# Patient Record
Sex: Male | Born: 1985 | Hispanic: Yes | Smoking: Never smoker
Health system: Southern US, Community
[De-identification: ages and names within clinical notes are randomized; demographics above are authoritative.]

## PROBLEM LIST (undated history)

## (undated) ENCOUNTER — Emergency Department: Admission: EM | Payer: Self-pay | Source: Home / Self Care

## (undated) DIAGNOSIS — E119 Type 2 diabetes mellitus without complications: Secondary | ICD-10-CM

---

## 2014-12-14 ENCOUNTER — Encounter: Payer: Self-pay | Admitting: *Deleted

## 2014-12-14 ENCOUNTER — Emergency Department
Admission: EM | Admit: 2014-12-14 | Discharge: 2014-12-14 | Disposition: A | Discharge status: Home / Self Care | Payer: Self-pay | Attending: Emergency Medicine | Admitting: Emergency Medicine

## 2014-12-14 DIAGNOSIS — Y9389 Activity, other specified: Secondary | ICD-10-CM | POA: Insufficient documentation

## 2014-12-14 DIAGNOSIS — Z23 Encounter for immunization: Secondary | ICD-10-CM | POA: Insufficient documentation

## 2014-12-14 DIAGNOSIS — Y9289 Other specified places as the place of occurrence of the external cause: Secondary | ICD-10-CM | POA: Insufficient documentation

## 2014-12-14 DIAGNOSIS — Y990 Civilian activity done for income or pay: Secondary | ICD-10-CM | POA: Insufficient documentation

## 2014-12-14 DIAGNOSIS — S81812A Laceration without foreign body, left lower leg, initial encounter: Secondary | ICD-10-CM | POA: Insufficient documentation

## 2014-12-14 DIAGNOSIS — W228XXA Striking against or struck by other objects, initial encounter: Secondary | ICD-10-CM | POA: Insufficient documentation

## 2014-12-14 DIAGNOSIS — IMO0002 Reserved for concepts with insufficient information to code with codable children: Secondary | ICD-10-CM

## 2014-12-14 MED ORDER — LIDOCAINE-EPINEPHRINE (PF) 1 %-1:200000 IJ SOLN
INTRAMUSCULAR | Status: AC
  Administered 2014-12-14: 30 mL
  Filled 2014-12-14: qty 30

## 2014-12-14 MED ORDER — TETANUS-DIPHTHERIA TOXOIDS TD 5-2 LFU IM INJ
0.5000 mL | INJECTION | Freq: Once | INTRAMUSCULAR | Status: AC
  Administered 2014-12-14: 0.5 mL via INTRAMUSCULAR
  Filled 2014-12-14: qty 0.5

## 2014-12-14 MED ORDER — LIDOCAINE-EPINEPHRINE (PF) 1 %-1:200000 IJ SOLN
30.0000 mL | Freq: Once | INTRAMUSCULAR | Status: AC
  Administered 2014-12-14: 30 mL

## 2014-12-14 NOTE — ED Notes (Signed)
Pt reports falling off ladder while at working and hitting his lower left leg on one of the steps, small wound noted to left lower leg in triage.

## 2014-12-14 NOTE — Discharge Instructions (Signed)
Cuidados de una laceración - Adultos  °(Laceration Care, Adult) ° Una herida cortante es un corte o lesión que atraviesa todas las capas de la piel y el tejido que se encuentra debajo de la piel.  °TRATAMIENTO  °Algunas laceraciones no requieren sutura. Algunas no deben cerrarse debido a que puede aumentar el riesgo de infección. Es importante que consulte al médico lo antes posible después de recibir una lesión para minimizar el riesgo de infección y aumentar la posibilidad de que se cierre con éxito.  °Cuando se cierra adecuadamente, podrán indicarle analgésicos, si los necesita. La herida debe limpiarse para combatir la infección. El médico usará puntos (suturas), grapas,adhesivo, o tiras adhesivas para reparar la laceración. Estos elementos mantendrán unidos los bordes de la piel para que se cure más rápidamente y para un mejor resultado cosmético. Sin embargo, todas las heridas se curarán con una cicatriz. Una vez que la herida se haya curado, las cicatrices pueden minimizarse cubriendo la herida con pantalla solar durante el día por un lapso se 1 año.  °INSTRUCCIONES PARA EL CUIDADO EN EL HOGAR  °Si tiene puntos o grapas:  °· Mantenga la herida limpia y seca. °· Si tiene un (vendaje) cámbielo al menos una vez al día. Cámbielo si se moja o se ensucia, o según las indicaciones del médico. °· Lave el corte dos veces por día con agua y jabón. Enjuáguelo con agua para quitar todo el jabón. Seque dando palmaditas con una toalla limpia y seca. °· Después de limpiar, aplique una delgada capa de una crema con antibiótico según las indicaciones del médico. Esto le ayudará a prevenir las infecciones y a evitar que el vendaje se adhiera. °· Puede ducharse después de las primeras 24 horas. No remoje la herida en agua hasta que le hayan quitado los puntos. °· Solo tome medicamentos que se pueden comprar sin receta o recetados para el dolor, malestar o fiebre, como le indica el médico. °· Concurra para que le retiren los  puntos o las grapas cuando el médico le indique. °En caso que tenga tiras adhesivas:  °· Mantenga la herida limpia y seca. °· No deje que las tiras se mojen. Puede darse un baño cuidando de mantener la herida seca. °· Si se moja, séquela dando palmaditas con una toalla limpia. °· Las tiras caerán por sí mismas. Puede recortar las tiras a medida que la herida se cura. No quite las tiras que están pegadas a la herida. Ellas se caerán cuando sea el momento. °En caso que le hayan aplicado adhesivo.  °· Podrá mojara momentáneamente la herida en la ducha o el baño. No frote ni sumerja la herida. No practique natación. Evite transpirar con abundancia hasta que el adhesivo se haya caído. Después de ducharse o darse un baño, seque el corte dando palmaditas con una toalla limpia. °· No aplique medicamentos líquidos, en crema o ungüentos mientras el adhesivo esté en su lugar. Podrá aflojarlo antes de que la herida se cure. °· Si tiene un vendaje, tenga cuidado de no aplicar cinta adhesiva directamente sobre el adhesivo. Esto puede hacer que el adhesivo se caiga antes de que la herida se haya curado. °· Evite la exposición prolongada a la luz del sol o a la lámpara solar mientras en adhesivo se encuentre en el lugar. La exposición a los rayos ultravioletas durante el primer año oscurecerá la cicatriz. °· El adhesivo permanecerá sobre la piel durante 5 a 10 días y luego caerá naturalmente. No quite la película de adhesivo. °Deberá aplicarse   la vacuna contra el tétanos si: °· No recuerda cuándo se colocó la vacuna la última vez. °· Nunca recibió esta vacuna. °Si le han aplicado la vacuna contra el tétanos, el brazo podrá hincharse, enrojecer y sentirse caliente al tacto. Esto es frecuente y no es un problema. Si usted necesita aplicarse la vacuna y se niega a recibirla, corre riesgo de contraer tétanos. Ésta es una enfermedad grave.  °SOLICITE ATENCIÓN MÉDICA SI:  °· Presenta enrojecimiento, hinchazón o aumento del dolor en la  herida. °· Hay rayas rojas que salen de la herida. °· Observa un líquido blanco amarillento (pus) en la herida. °· Tiene fiebre. °· Advierte un olor fétido que proviene de la herida o del vendaje. °· La herida se abre luego de que le han extraído las suturas. °· Nota que en la herida hay algún cuerpo extraño como un trozo de madera o vidrio. °· La herida está en su mano o pie y observa que no puede mover correctamente los dedos. °SOLICITE ATENCIÓN MÉDICA DE INMEDIATO SI:  °· El dolor no se alivia con los medicamentos. °· Hay una zona muy hinchada alrededor de la herida que le causa dolor y adormecimiento, o advierte un cambio en el color en el brazo, la mano, la pierna o el pie. °· La herida se abre y sangra nuevamente. °· Siente que el adormecimiento, la debilidad o la pérdida de la función de la articulación que rodea la herida empeoran. °· Palpa nódulos dolorosos cerca de la herida o bajo la piel en cualquier zona del cuerpo. °ASEGÚRESE DE QUE:  °· Comprende estas instrucciones. °· Controlará su enfermedad. °· Solicitará ayuda de inmediato si no mejora o si empeora. °Document Released: 05/18/2005 Document Revised: 08/10/2011 °ExitCare® Patient Information ©2015 [employer]. This information is not intended to replace advice given to you by your health care provider. Make sure you discuss any questions you have with your health care provider. ° °

## 2014-12-14 NOTE — ED Provider Notes (Signed)
[facility] Emergency Department Provider Note  ____________________________________________  Time seen: On arrival  I have reviewed the triage vital signs and the nursing notes.   HISTORY  Chief Complaint Leg Pain    HPI Dale Schwartz is a 29 y.o. male who presents with a laceration to his left lower leg which was caused after bumping it against a ladder while at work. He is not sure when his last tetanus shot was. No other injuries reported.    History reviewed. No pertinent past medical history.  There are no active problems to display for this patient.   History reviewed. No pertinent past surgical history.  No current outpatient prescriptions on file.  Allergies Review of patient's allergies indicates no known allergies.  No family history on file.  Social History History  Substance Use Topics  . Smoking status: Never Smoker   . Smokeless tobacco: Not on file  . Alcohol Use: No    Review of Systems  Constitutional: Negative for fever. Eyes: Negative for visual changes. ENT: No injury Musculoskeletal: Negative for back pain. Skin: Negative for rash. Positive for laceration Neurological: Negative for headaches or focal weakness   ____________________________________________   PHYSICAL EXAM:  VITAL SIGNS: ED Triage Vitals  Enc Vitals Group     BP 12/14/14 1516 119/76 mmHg     Pulse Rate 12/14/14 1516 64     Resp 12/14/14 1516 16     Temp 12/14/14 1516 98.3 F (36.8 C)     Temp Source 12/14/14 1516 Oral     SpO2 12/14/14 1516 99 %     Weight --      Height --      Head Cir --      Peak Flow --      Pain Score 12/14/14 1517 5     Pain Loc --      Pain Edu? --      Excl. in GC? --      Constitutional: Alert and oriented. Well appearing and in no distress. Eyes: Conjunctivae are normal.  ENT   Head: Normocephalic and atraumatic.   Mouth/Throat: Mucous membranes are moist. Cardiovascular: Normal  rate, regular rhythm.  Respiratory: Normal respiratory effort without tachypnea nor retractions.  Gastrointestinal: Soft and non-tender in all quadrants. No distention. There is no CVA tenderness. Musculoskeletal: Nontender with normal range of motion in all extremities. Neurologic:  Normal speech and language. No gross focal neurologic deficits are appreciated. Skin:  Skin is warm, dry. 4 cm laceration to the left anterior tibia, irregular, grossly linear. Psychiatric: Mood and affect are normal. Patient exhibits appropriate insight and judgment.  ____________________________________________    LABS (pertinent positives/negatives)  Labs Reviewed - No data to display  ____________________________________________     ____________________________________________    RADIOLOGY I have personally reviewed any xrays that were ordered on this patient: None  ____________________________________________   PROCEDURES  Procedure(s) performed: yes  LACERATION REPAIR Performed by: Jene EveryKINNER, Shrey Boike Authorized by: Jene EveryKINNER, Leidy Massar Consent: Verbal consent obtained. Consent given by: patient Prepped and Draped in normal sterile fashion Wound explored  Laceration Location: Left anterior tibia  Laceration Length: 4 cm  No Foreign Bodies seen or palpated  Anesthesia: local infiltration  Local anesthetic: lidocaine 1%  with epinephrine  Anesthetic total: Four ml  Irrigation method: syringe Amount of cleaning: standard  Skin closure: Sutures   Number of sutures: 2   Technique: Horizontal mattress   Patient tolerance: Patient tolerated the procedure well with no immediate complications.  ____________________________________________   INITIAL IMPRESSION / ASSESSMENT AND PLAN / ED COURSE  Pertinent labs & imaging results that were available during my care of the patient were reviewed by me and considered in my medical decision making (see chart for details).  Laceration  repaired, tetanus given. Sutures need to be removed in 10 days  ____________________________________________   FINAL CLINICAL IMPRESSION(S) / ED DIAGNOSES  Final diagnoses:  Laceration     Jene Every, MD 12/14/14 [phone]

## 2019-02-12 ENCOUNTER — Emergency Department: Payer: Self-pay

## 2019-02-12 ENCOUNTER — Other Ambulatory Visit: Payer: Self-pay

## 2019-02-12 ENCOUNTER — Emergency Department
Admission: EM | Admit: 2019-02-12 | Discharge: 2019-02-12 | Disposition: A | Discharge status: Home / Self Care | Payer: Self-pay | Attending: Emergency Medicine | Admitting: Emergency Medicine

## 2019-02-12 DIAGNOSIS — X58XXXD Exposure to other specified factors, subsequent encounter: Secondary | ICD-10-CM | POA: Insufficient documentation

## 2019-02-12 DIAGNOSIS — Y939 Activity, unspecified: Secondary | ICD-10-CM | POA: Insufficient documentation

## 2019-02-12 DIAGNOSIS — Y990 Civilian activity done for income or pay: Secondary | ICD-10-CM | POA: Insufficient documentation

## 2019-02-12 DIAGNOSIS — M25561 Pain in right knee: Secondary | ICD-10-CM | POA: Insufficient documentation

## 2019-02-12 DIAGNOSIS — Y9289 Other specified places as the place of occurrence of the external cause: Secondary | ICD-10-CM | POA: Insufficient documentation

## 2019-02-12 MED ORDER — MELOXICAM 15 MG PO TABS: 15.0000 mg | ORAL_TABLET | Freq: Every day | ORAL | 1 refills | Status: AC

## 2019-02-12 NOTE — ED Notes (Signed)
Pt with c/o right knee pain and states it comes out of joint frequently if he bends it too far.  Injury from several months ago. Pt states he is getting PT in [place] but they recommended he get images of his knee due to frequent dislocation.

## 2019-02-12 NOTE — ED Provider Notes (Signed)
[facility] Emergency Department Provider Note  ____________________________________________  Time seen: Approximately 9:35 PM  I have reviewed the triage vital signs and the nursing notes.   HISTORY  Chief Complaint Knee Pain    HPI Dale Schwartz is a 33 y.o. male presents to the emergency department with chronic right knee pain.  Patient states that he was injured at work several months ago.  Patient states that he has a locking and catching sensation of the right knee and patient states that his right knee bothers him when he stands for prolonged amount of time.  No numbness or tingling in the bilateral lower extremities.  No weakness.  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  No other alleviating measures have been attempted.        No past medical history on file.  There are no active problems to display for this patient.   No past surgical history on file.  Prior to Admission medications   Medication Sig Start Date End Date Taking? Authorizing Provider  meloxicam (MOBIC) 15 MG tablet Take 1 tablet (15 mg total) by mouth daily for 7 days. 02/12/19 02/19/19  Orvil FeilWoods, Deena Shaub M, PA-C    Allergies Patient has no known allergies.  No family history on file.  Social History Social History   Tobacco Use  . Smoking status: Never Smoker  Substance Use Topics  . Alcohol use: No  . Drug use: No     Review of Systems  Constitutional: No fever/chills Eyes: No visual changes. No discharge ENT: No upper respiratory complaints. Cardiovascular: no chest pain. Respiratory: no cough. No SOB. Gastrointestinal: No abdominal pain.  No nausea, no vomiting.  No diarrhea.  No constipation. Musculoskeletal: Patient has right knee pain. Skin: Negative for rash, abrasions, lacerations, ecchymosis. Neurological: Negative for headaches, focal weakness or  numbness.   ____________________________________________   PHYSICAL EXAM:  VITAL SIGNS: ED Triage Vitals  Enc Vitals Group     BP 02/12/19 1937 (!) 143/88     Pulse Rate 02/12/19 1937 100     Resp 02/12/19 1937 16     Temp 02/12/19 1937 98.6 F (37 C)     Temp Source 02/12/19 1937 Oral     SpO2 02/12/19 1937 97 %     Weight 02/12/19 1941 225 lb (102.1 kg)     Height 02/12/19 1941 5\' 9"  (1.753 m)     Head Circumference --      Peak Flow --      Pain Score 02/12/19 1940 6     Pain Loc --      Pain Edu? --      Excl. in GC? --      Constitutional: Alert and oriented. Well appearing and in no acute distress. Eyes: Conjunctivae are normal. PERRL. EOMI. Head: Atraumatic. Cardiovascular: Normal rate, regular rhythm. Normal S1 and S2.  Good peripheral circulation. Respiratory: Normal respiratory effort without tachypnea or retractions. Lungs CTAB. Good air entry to the bases with no decreased or absent breath sounds. Musculoskeletal: Full range of motion to all extremities. No gross deformities appreciated. Neurologic:  Normal speech and language. No gross focal neurologic deficits are appreciated.  Skin:  Skin is warm, dry and intact. No rash noted. Psychiatric: Mood and affect are normal. Speech and behavior are normal. Patient exhibits appropriate insight and judgement.   ____________________________________________   LABS (all labs ordered are listed, but only abnormal results are displayed)  Labs  Reviewed - No data to display ____________________________________________  EKG   ____________________________________________  RADIOLOGY I personally viewed and evaluated these images as part of my medical decision making, as well as reviewing the written report by the radiologist.  Dg Knee Complete 4 Views Right  Result Date: 02/12/2019 CLINICAL DATA:  5 month history of knee pain after work related injury. EXAM: RIGHT KNEE - COMPLETE 4+ VIEW COMPARISON:  No  comparison studies available. FINDINGS: No fracture. No subluxation or dislocation. Hypertrophic spurring is visible in all 3 compartments. No joint effusion. No suspicious lytic or sclerotic osseous abnormality. IMPRESSION: Tricompartmental degenerative changes without acute bony abnormality. Electronically Signed   By: Misty Stanley M.D.   On: 02/12/2019 21:06    ____________________________________________    PROCEDURES  Procedure(s) performed:    Procedures    Medications - No data to display   ____________________________________________   INITIAL IMPRESSION / ASSESSMENT AND PLAN / ED COURSE  Pertinent labs & imaging results that were available during my care of the patient were reviewed by me and considered in my medical decision making (see chart for details).  Review of the [place] CSRS was performed in accordance of the [place] prior to dispensing any controlled drugs.           Assessment and plan Right knee pain 33 year old male presents to the emergency department with chronic right knee pain.  X-ray examination revealed tricompartmental degenerative changes.  I recommended daily meloxicam and follow-up with orthopedics.  Patient was given a work note for the next 2 days.  All patient questions were answered.    ____________________________________________  FINAL CLINICAL IMPRESSION(S) / ED DIAGNOSES  Final diagnoses:  Acute pain of right knee      NEW MEDICATIONS STARTED DURING THIS VISIT:  ED Discharge Orders         Ordered    meloxicam (MOBIC) 15 MG tablet  Daily     02/12/19 2133              This chart was dictated using voice recognition software/Dragon. Despite best efforts to proofread, errors can occur which can change the meaning. Any change was purely unintentional.    Karren Cobble 02/12/19 2137    Duffy Bruce, MD 02/14/19 [phone]

## 2019-02-12 NOTE — ED Triage Notes (Signed)
Info obtained via [facility] interpreter [place].  Reports right knee pain for approximately 5 months after a work related injury. Seen at a clinic in [place] and they recommended  a scan of the knee but did not follow up.  Reports occasional "hits" knee at work and it hurts.

## 2019-05-18 ENCOUNTER — Emergency Department: Payer: Self-pay

## 2019-05-18 ENCOUNTER — Inpatient Hospital Stay
Admission: EM | Admit: 2019-05-18 | Discharge: 2019-05-29 | DRG: 392 | Disposition: A | Discharge status: Home / Self Care | Payer: Self-pay | Attending: Surgery | Admitting: Surgery

## 2019-05-18 ENCOUNTER — Encounter: Payer: Self-pay | Admitting: Emergency Medicine

## 2019-05-18 ENCOUNTER — Other Ambulatory Visit: Payer: Self-pay

## 2019-05-18 DIAGNOSIS — Z23 Encounter for immunization: Secondary | ICD-10-CM

## 2019-05-18 DIAGNOSIS — Z20828 Contact with and (suspected) exposure to other viral communicable diseases: Secondary | ICD-10-CM | POA: Diagnosis present

## 2019-05-18 DIAGNOSIS — K5720 Diverticulitis of large intestine with perforation and abscess without bleeding: Principal | ICD-10-CM

## 2019-05-18 DIAGNOSIS — K5780 Diverticulitis of intestine, part unspecified, with perforation and abscess without bleeding: Secondary | ICD-10-CM

## 2019-05-18 DIAGNOSIS — E669 Obesity, unspecified: Secondary | ICD-10-CM | POA: Diagnosis present

## 2019-05-18 DIAGNOSIS — K760 Fatty (change of) liver, not elsewhere classified: Secondary | ICD-10-CM | POA: Diagnosis present

## 2019-05-18 DIAGNOSIS — K567 Ileus, unspecified: Secondary | ICD-10-CM | POA: Diagnosis not present

## 2019-05-18 DIAGNOSIS — K3533 Acute appendicitis with perforation and localized peritonitis, with abscess: Secondary | ICD-10-CM

## 2019-05-18 DIAGNOSIS — J9811 Atelectasis: Secondary | ICD-10-CM | POA: Diagnosis present

## 2019-05-18 DIAGNOSIS — Z6837 Body mass index (BMI) 37.0-37.9, adult: Secondary | ICD-10-CM

## 2019-05-18 DIAGNOSIS — B954 Other streptococcus as the cause of diseases classified elsewhere: Secondary | ICD-10-CM | POA: Diagnosis present

## 2019-05-18 DIAGNOSIS — B9620 Unspecified Escherichia coli [E. coli] as the cause of diseases classified elsewhere: Secondary | ICD-10-CM | POA: Diagnosis present

## 2019-05-18 HISTORY — DX: Type 2 diabetes mellitus without complications: E11.9

## 2019-05-18 LAB — URINALYSIS, COMPLETE (UACMP) WITH MICROSCOPIC
Bacteria, UA: NONE SEEN
Bilirubin Urine: NEGATIVE
Glucose, UA: NEGATIVE mg/dL
Hgb urine dipstick: NEGATIVE
Ketones, ur: NEGATIVE mg/dL
Leukocytes,Ua: NEGATIVE
Nitrite: NEGATIVE
Protein, ur: 30 mg/dL — AB
Specific Gravity, Urine: 1.023 (ref 1.005–1.030)
pH: 7 (ref 5.0–8.0)

## 2019-05-18 LAB — CBC
HCT: 40.8 % (ref 39.0–52.0)
Hemoglobin: 14 g/dL (ref 13.0–17.0)
MCH: 29.7 pg (ref 26.0–34.0)
MCHC: 34.3 g/dL (ref 30.0–36.0)
MCV: 86.4 fL (ref 80.0–100.0)
Platelets: 290 10*3/uL (ref 150–400)
RBC: 4.72 MIL/uL (ref 4.22–5.81)
RDW: 12.3 % (ref 11.5–15.5)
WBC: 15.7 10*3/uL — ABNORMAL HIGH (ref 4.0–10.5)
nRBC: 0 % (ref 0.0–0.2)

## 2019-05-18 LAB — COMPREHENSIVE METABOLIC PANEL
ALT: 55 U/L — ABNORMAL HIGH (ref 0–44)
AST: 26 U/L (ref 15–41)
Albumin: 4.3 g/dL (ref 3.5–5.0)
Alkaline Phosphatase: 44 U/L (ref 38–126)
Anion gap: 11 (ref 5–15)
BUN: 13 mg/dL (ref 6–20)
CO2: 23 mmol/L (ref 22–32)
Calcium: 9.3 mg/dL (ref 8.9–10.3)
Chloride: 102 mmol/L (ref 98–111)
Creatinine, Ser: 0.95 mg/dL (ref 0.61–1.24)
GFR calc Af Amer: >60 mL/min (ref >60)
GFR calc non Af Amer: >60 mL/min (ref >60)
Glucose, Bld: 121 mg/dL — ABNORMAL HIGH (ref 70–99)
Potassium: 4.1 mmol/L (ref 3.5–5.1)
Sodium: 136 mmol/L (ref 135–145)
Total Bilirubin: 1.7 mg/dL — ABNORMAL HIGH (ref 0.3–1.2)
Total Protein: 7.9 g/dL (ref 6.5–8.1)

## 2019-05-18 LAB — RESPIRATORY PANEL BY RT PCR (FLU A&B, COVID)
Influenza A by PCR: NEGATIVE
Influenza B by PCR: NEGATIVE
SARS Coronavirus 2 by RT PCR: NEGATIVE

## 2019-05-18 LAB — LIPASE, BLOOD: Lipase: 21 U/L (ref 11–51)

## 2019-05-18 LAB — HIV ANTIBODY (ROUTINE TESTING W REFLEX): HIV Screen 4th Generation wRfx: NONREACTIVE

## 2019-05-18 MED ORDER — KETOROLAC TROMETHAMINE 30 MG/ML IJ SOLN
30.0000 mg | Freq: Four times a day (QID) | INTRAMUSCULAR | Status: AC | PRN
  Administered 2019-05-23 – 2019-05-24 (×2): 30 mg via INTRAVENOUS
  Filled 2019-05-18 (×2): qty 1

## 2019-05-18 MED ORDER — MORPHINE SULFATE (PF) 2 MG/ML IV SOLN
2.0000 mg | INTRAVENOUS | Status: DC | PRN
  Administered 2019-05-18 – 2019-05-19 (×2): 2 mg via INTRAVENOUS
  Administered 2019-05-20: 4 mg via INTRAVENOUS
  Administered 2019-05-24: 2 mg via INTRAVENOUS
  Administered 2019-05-24 – 2019-05-26 (×5): 4 mg via INTRAVENOUS
  Administered 2019-05-26: 2 mg via INTRAVENOUS
  Filled 2019-05-18 (×2): qty 2
  Filled 2019-05-18: qty 1
  Filled 2019-05-18: qty 2
  Filled 2019-05-18: qty 1
  Filled 2019-05-18: qty 2
  Filled 2019-05-18: qty 1
  Filled 2019-05-18 (×3): qty 2
  Filled 2019-05-18: qty 1

## 2019-05-18 MED ORDER — PIPERACILLIN-TAZOBACTAM 3.375 G IVPB
3.3750 g | Freq: Three times a day (TID) | INTRAVENOUS | Status: DC
  Filled 2019-05-18: qty 50

## 2019-05-18 MED ORDER — ENOXAPARIN SODIUM 40 MG/0.4ML ~~LOC~~ SOLN
40.0000 mg | SUBCUTANEOUS | Status: DC
  Administered 2019-05-18 – 2019-05-22 (×5): 40 mg via SUBCUTANEOUS
  Filled 2019-05-18 (×5): qty 0.4

## 2019-05-18 MED ORDER — METOPROLOL TARTRATE 5 MG/5ML IV SOLN
5.0000 mg | Freq: Four times a day (QID) | INTRAVENOUS | Status: DC | PRN
  Administered 2019-05-21: 5 mg via INTRAVENOUS
  Filled 2019-05-18 (×2): qty 5

## 2019-05-18 MED ORDER — INFLUENZA VAC SPLIT QUAD 0.5 ML IM SUSY
0.5000 mL | PREFILLED_SYRINGE | INTRAMUSCULAR | Status: AC
  Administered 2019-05-29: 08:00:00 0.5 mL via INTRAMUSCULAR
  Filled 2019-05-18: qty 0.5

## 2019-05-18 MED ORDER — SODIUM CHLORIDE 0.9 % IV BOLUS
1000.0000 mL | Freq: Once | INTRAVENOUS | Status: AC
  Administered 2019-05-18: 10:00:00 1000 mL via INTRAVENOUS

## 2019-05-18 MED ORDER — OXYCODONE HCL 5 MG PO TABS
5.0000 mg | ORAL_TABLET | ORAL | Status: DC | PRN
  Administered 2019-05-18: 22:00:00 5 mg via ORAL
  Administered 2019-05-19 – 2019-05-25 (×6): 10 mg via ORAL
  Filled 2019-05-18: qty 1
  Filled 2019-05-18 (×6): qty 2

## 2019-05-18 MED ORDER — ONDANSETRON 4 MG PO TBDP: 4.0000 mg | ORAL_TABLET | Freq: Four times a day (QID) | ORAL | Status: DC | PRN

## 2019-05-18 MED ORDER — ACETAMINOPHEN 500 MG PO TABS
1000.0000 mg | ORAL_TABLET | Freq: Four times a day (QID) | ORAL | Status: DC
  Administered 2019-05-18 – 2019-05-29 (×34): 1000 mg via ORAL
  Filled 2019-05-18 (×37): qty 2

## 2019-05-18 MED ORDER — SODIUM CHLORIDE 0.9 % IV SOLN: INTRAVENOUS | Status: DC

## 2019-05-18 MED ORDER — ONDANSETRON HCL 4 MG/2ML IJ SOLN
4.0000 mg | Freq: Four times a day (QID) | INTRAMUSCULAR | Status: DC | PRN
  Administered 2019-05-20 – 2019-05-25 (×6): 4 mg via INTRAVENOUS
  Filled 2019-05-18 (×6): qty 2

## 2019-05-18 MED ORDER — KETOROLAC TROMETHAMINE 30 MG/ML IJ SOLN
30.0000 mg | Freq: Four times a day (QID) | INTRAMUSCULAR | Status: AC
  Administered 2019-05-18 – 2019-05-23 (×20): 30 mg via INTRAVENOUS
  Filled 2019-05-18 (×20): qty 1

## 2019-05-18 MED ORDER — PIPERACILLIN-TAZOBACTAM 3.375 G IVPB 30 MIN
3.3750 g | Freq: Once | INTRAVENOUS | Status: AC
  Administered 2019-05-18: 10:00:00 3.375 g via INTRAVENOUS
  Filled 2019-05-18: qty 50

## 2019-05-18 MED ORDER — PIPERACILLIN-TAZOBACTAM 3.375 G IVPB
3.3750 g | Freq: Three times a day (TID) | INTRAVENOUS | Status: DC
  Administered 2019-05-18 – 2019-05-29 (×33): 3.375 g via INTRAVENOUS
  Filled 2019-05-18 (×36): qty 50

## 2019-05-18 MED ORDER — SODIUM CHLORIDE 0.9 % IV SOLN
INTRAVENOUS | Status: DC | PRN
  Administered 2019-05-18 – 2019-05-25 (×3): 250 mL via INTRAVENOUS
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
## 2019-05-18 NOTE — ED Notes (Signed)
Report given to [facility] RN on [place], pt will be transported to room after 1930

## 2019-05-19 ENCOUNTER — Inpatient Hospital Stay: Payer: Self-pay

## 2019-05-19 LAB — BASIC METABOLIC PANEL
Anion gap: 10 (ref 5–15)
BUN: 17 mg/dL (ref 6–20)
CO2: 24 mmol/L (ref 22–32)
Calcium: 8.6 mg/dL — ABNORMAL LOW (ref 8.9–10.3)
Chloride: 104 mmol/L (ref 98–111)
Creatinine, Ser: 1.15 mg/dL (ref 0.61–1.24)
GFR calc Af Amer: >60 mL/min (ref >60)
GFR calc non Af Amer: >60 mL/min (ref >60)
Glucose, Bld: 130 mg/dL — ABNORMAL HIGH (ref 70–99)
Potassium: 3.9 mmol/L (ref 3.5–5.1)
Sodium: 138 mmol/L (ref 135–145)

## 2019-05-19 LAB — CBC
HCT: 37.2 % — ABNORMAL LOW (ref 39.0–52.0)
Hemoglobin: 12.6 g/dL — ABNORMAL LOW (ref 13.0–17.0)
MCH: 29.4 pg (ref 26.0–34.0)
MCHC: 33.9 g/dL (ref 30.0–36.0)
MCV: 86.9 fL (ref 80.0–100.0)
Platelets: 242 10*3/uL (ref 150–400)
RBC: 4.28 MIL/uL (ref 4.22–5.81)
RDW: 12.5 % (ref 11.5–15.5)
WBC: 20.1 10*3/uL — ABNORMAL HIGH (ref 4.0–10.5)
nRBC: 0 % (ref 0.0–0.2)

## 2019-05-19 LAB — PHOSPHORUS: Phosphorus: 3 mg/dL (ref 2.5–4.6)

## 2019-05-19 LAB — MAGNESIUM: Magnesium: 1.7 mg/dL (ref 1.7–2.4)

## 2019-05-19 MED ORDER — SODIUM CHLORIDE 0.9 % IV BOLUS
1000.0000 mL | Freq: Once | INTRAVENOUS | Status: AC
  Administered 2019-05-19: 08:00:00 1000 mL via INTRAVENOUS

## 2019-05-19 NOTE — Progress Notes (Signed)
[facility] SURGICAL PROGRESS NOTE (cpt [phone])  Hospital Day(s): 1.   Interval History: Patient seen and examined, no acute events or new complaints overnight. Patient reports his abdominal pain has improved compared to yesterday, still in the lower abdomen, less severe. No nausea or emesis. Fever to 101.4 this morning at 0530, tachycardic 116, leukocytosis worsening to 20K. No other new issues or complaints.   Review of Systems:  Constitutional: + fever HEENT: denies cough or congestion  Respiratory: denies any shortness of breath  Cardiovascular: denies chest pain or palpitations  Gastrointestinal: + abdominal pain (improved), denied N/V, or diarrhea/and bowel function as per interval history Genitourinary: denies burning with urination or urinary frequency   Vital signs in last 24 hours: [min-max] current  Temp:  [99.1 F (37.3 C)-101.4 F (38.6 C)] 99.1 F (37.3 C) (12/18 0647) Pulse Rate:  [99-116] 116 (12/18 0526) Resp:  [18] 18 (12/18 0526) BP: (113-138)/(72-89) 131/87 (12/18 0526) SpO2:  [95 %-97 %] 95 % (12/18 0526)     Height: 5\' 8"  (172.7 cm) Weight: 99.8 kg BMI (Calculated): 33.46   Intake/Output last 2 shifts:  12/17 0701 - 12/18 0700 In: 6 [IV Piggyback:50] Out: -    Physical Exam:  Constitutional: alert, cooperative and no distress  HENT: normocephalic without obvious abnormality  Eyes: PERRL, EOM's grossly intact and symmetric  Respiratory: breathing non-labored at rest  Cardiovascular: tachycardic and sinus rhythm  Gastrointestinal: soft, diffuse lower abdominal tenderness, improved from exam yesterday, and non-distended. No rebound/guarding, no peritonitis Musculoskeletal: no edema or wounds, motor and sensation grossly intact, NT    Labs:  CBC Latest Ref Rng & Units 05/19/2019 05/18/2019  WBC 4.0 - 10.5 K/uL 20.1(H) 15.7(H)  Hemoglobin 13.0 - 17.0 g/dL 12.6(L) 14.0  Hematocrit 39.0 - 52.0 % 37.2(L) 40.8  Platelets 150 - 400 K/uL 242  290   CMP Latest Ref Rng & Units 05/19/2019 05/18/2019  Glucose 70 - 99 mg/dL 130(H) 121(H)  BUN 6 - 20 mg/dL 17 13  Creatinine 0.61 - 1.24 mg/dL 1.15 0.95  Sodium 135 - 145 mmol/L 138 136  Potassium 3.5 - 5.1 mmol/L 3.9 4.1  Chloride 98 - 111 mmol/L 104 102  CO2 22 - 32 mmol/L 24 23  Calcium 8.9 - 10.3 mg/dL 8.6(L) 9.3  Total Protein 6.5 - 8.1 g/dL - 7.9  Total Bilirubin 0.3 - 1.2 mg/dL - 1.7(H)  Alkaline Phos 38 - 126 U/L - 44  AST 15 - 41 U/L - 26  ALT 0 - 44 U/L - 55(H)     Imaging studies:   KUB (05/19/2019) personally reviewed which does not show evidence of pneumoperitoneum, and radiologist report reviewed:  IMPRESSION: 1. Limited exam of the abdomen, only the upper abdomen imaged. No prominent bowel distention noted. No free air. No acute abnormality identified.  2.  Mild bibasilar subsegmental atelectasis.   Assessment/Plan: (ICD-10's: K37.20) 33 y.o. male who is febrile this morning, tachycardic, and had worsening leukocytosis attributed to acute diverticulitis with contained perforation and early abscess vs phlegmon however clinically he appears improved and his abdominal examination is without peritonitis and no pneumoperitoneum on KUB this morning   - Despite fever and worsening leukocytosis he appears clinically improved. No free air on AXR. WE will continue to closely monitor. Likely repeat imaging tomorrow unless significant improvement to evaluate for drainable abscess. If he clinically deteriorates then likely proceed with Hartman's. He was updated on plan and understands.     - NPO  - Continue IV Abx (  Zosyn) --> Day 2  - Continue IVF resuscitation (150 ml/hr)   - pain control prn  - monitor abdominal examination  - Monitor leukocytosis; fever curve --> morning labs   - mobilize as tolerates             - medical management of any comorbid conditions             - DVT prophylaxis   All of the above findings and recommendations were discussed with the  patient, and the medical team, and all of patient's questions were answered to his expressed satisfaction.  -- Lynden Oxford, PA-C [facility] 05/19/2019, 7:28 AM [phone] M-F: 7am - 4pm

## 2019-05-20 DIAGNOSIS — K5720 Diverticulitis of large intestine with perforation and abscess without bleeding: Principal | ICD-10-CM

## 2019-05-20 LAB — BASIC METABOLIC PANEL
Anion gap: 13 (ref 5–15)
BUN: 15 mg/dL (ref 6–20)
CO2: 19 mmol/L — ABNORMAL LOW (ref 22–32)
Calcium: 8.4 mg/dL — ABNORMAL LOW (ref 8.9–10.3)
Chloride: 107 mmol/L (ref 98–111)
Creatinine, Ser: 0.9 mg/dL (ref 0.61–1.24)
GFR calc Af Amer: >60 mL/min (ref >60)
GFR calc non Af Amer: >60 mL/min (ref >60)
Glucose, Bld: 114 mg/dL — ABNORMAL HIGH (ref 70–99)
Potassium: 3.6 mmol/L (ref 3.5–5.1)
Sodium: 139 mmol/L (ref 135–145)

## 2019-05-20 LAB — CBC
HCT: 34.8 % — ABNORMAL LOW (ref 39.0–52.0)
Hemoglobin: 11.8 g/dL — ABNORMAL LOW (ref 13.0–17.0)
MCH: 29.9 pg (ref 26.0–34.0)
MCHC: 33.9 g/dL (ref 30.0–36.0)
MCV: 88.1 fL (ref 80.0–100.0)
Platelets: 230 10*3/uL (ref 150–400)
RBC: 3.95 MIL/uL — ABNORMAL LOW (ref 4.22–5.81)
RDW: 12.5 % (ref 11.5–15.5)
WBC: 14.9 10*3/uL — ABNORMAL HIGH (ref 4.0–10.5)
nRBC: 0 % (ref 0.0–0.2)

## 2019-05-20 MED ORDER — LACTATED RINGERS IV BOLUS
1000.0000 mL | Freq: Once | INTRAVENOUS | Status: AC
  Administered 2019-05-20: 11:00:00 1000 mL via INTRAVENOUS

## 2019-05-20 NOTE — Progress Notes (Signed)
[facility] SURGICAL PROGRESS NOTE (cpt [phone])  Hospital Day(s): 2.   Interval History: Patient states abdominal pain has improved.  No new fevers.  White blood cell count continues to improve.  Remains tachycardic.  Review of Systems:  Constitutional: + fever HEENT: denies cough or congestion  Respiratory: denies any shortness of breath  Cardiovascular: denies chest pain or palpitations  Gastrointestinal: + abdominal pain (improved), denied N/V, or diarrhea/and bowel function as per interval history Genitourinary: denies burning with urination or urinary frequency   Vital signs in last 24 hours: [min-max] current  Temp:  [98.9 F (37.2 C)-100.8 F (38.2 C)] 99.9 F (37.7 C) (12/19 0510) Pulse Rate:  [104-123] 123 (12/19 0510) Resp:  [16-24] 24 (12/19 0510) BP: (141-152)/(94-106) 145/97 (12/19 0510) SpO2:  [93 %-98 %] 93 % (12/19 0510)     Height: 5\' 8"  (172.7 cm) Weight: 99.8 kg BMI (Calculated): 33.46   Intake/Output last 2 shifts:  12/18 0701 - 12/19 0700 In: 4557 [I.V.:4357; IV Piggyback:200] Out: 950 [Urine:950]   Physical Exam:  Constitutional: alert, cooperative and no distress  HENT: normocephalic without obvious abnormality  Eyes: PERRL, EOM's grossly intact and symmetric  Respiratory: breathing non-labored at rest  Cardiovascular: tachycardic and sinus rhythm  Gastrointestinal: Mild tenderness in the lower abdomen, and non-distended. No rebound/guarding, no peritonitis Musculoskeletal: no edema or wounds, motor and sensation grossly intact, NT    Labs:  CBC Latest Ref Rng & Units 05/20/2019 05/19/2019 05/18/2019  WBC 4.0 - 10.5 K/uL 14.9(H) 20.1(H) 15.7(H)  Hemoglobin 13.0 - 17.0 g/dL 11.8(L) 12.6(L) 14.0  Hematocrit 39.0 - 52.0 % 34.8(L) 37.2(L) 40.8  Platelets 150 - 400 K/uL 230 242 290   CMP Latest Ref Rng & Units 05/20/2019 05/19/2019 05/18/2019  Glucose 70 - 99 mg/dL 05/20/2019) 563(O) 756(E)  BUN 6 - 20 mg/dL 15 17 13   Creatinine 0.61 -  1.24 mg/dL 332(R 5.18  Sodium 135 - 145 mmol/L 139 138 136  Potassium 3.5 - 5.1 mmol/L 3.6 3.9 4.1  Chloride 98 - 111 mmol/L 107 104 102  CO2 22 - 32 mmol/L 19(L) 24 23  Calcium 8.9 - 10.3 mg/dL 8.41) 6.60) 9.3  Total Protein 6.5 - 8.1 g/dL - - 7.9  Total Bilirubin 0.3 - 1.2 mg/dL - - 1.7(H)  Alkaline Phos 38 - 126 U/L - - 44  AST 15 - 41 U/L - - 26  ALT 0 - 44 U/L - - 55(H)     Imaging studies:   KUB (05/19/2019) personally reviewed which does not show evidence of pneumoperitoneum, and radiologist report reviewed:  IMPRESSION: 1. Limited exam of the abdomen, only the upper abdomen imaged. No prominent bowel distention noted. No free air. No acute abnormality identified.  2.  Mild bibasilar subsegmental atelectasis.   Assessment/Plan: (ICD-10's: K72.20) 33 y.o. male with acute diverticulitis with contained perforation and early abscess vs phlegmon.  Yesterday, he was febrile and had an elevation in his white blood cell count.  He has had no new fevers and his leukocytosis is improving.  He remains tachycardic.  His abdominal examination is without peritonitis.   -White count improved and patient has defervesced, no need to reimage today, however if this changes, we may repeat imaging or proceed with Hartman's.  He was updated on plan and understands.     - NPO, except will permit a few ice chips for comfort  - Continue IV Abx (Zosyn) --> Day 3  - Continue IVF resuscitation (150 ml/hr); will give additional  bolus for tachycardia this morning  - pain control prn  - monitor abdominal examination  - Monitor leukocytosis; fever curve --> morning labs   - mobilize as tolerates             - medical management of any comorbid conditions             - DVT prophylaxis   All of the above findings and recommendations were discussed with the patient, and the medical team, and all of patient's questions were answered to his expressed satisfaction.

## 2019-05-21 LAB — BASIC METABOLIC PANEL
Anion gap: 10 (ref 5–15)
BUN: 16 mg/dL (ref 6–20)
CO2: 22 mmol/L (ref 22–32)
Calcium: 8.1 mg/dL — ABNORMAL LOW (ref 8.9–10.3)
Chloride: 107 mmol/L (ref 98–111)
Creatinine, Ser: 0.79 mg/dL (ref 0.61–1.24)
GFR calc Af Amer: >60 mL/min (ref >60)
GFR calc non Af Amer: >60 mL/min (ref >60)
Glucose, Bld: 100 mg/dL — ABNORMAL HIGH (ref 70–99)
Potassium: 3.3 mmol/L — ABNORMAL LOW (ref 3.5–5.1)
Sodium: 139 mmol/L (ref 135–145)

## 2019-05-21 LAB — MAGNESIUM: Magnesium: 2 mg/dL (ref 1.7–2.4)

## 2019-05-21 LAB — PHOSPHORUS: Phosphorus: 2.6 mg/dL (ref 2.5–4.6)

## 2019-05-21 LAB — CBC
HCT: 31.3 % — ABNORMAL LOW (ref 39.0–52.0)
Hemoglobin: 10.9 g/dL — ABNORMAL LOW (ref 13.0–17.0)
MCH: 29.2 pg (ref 26.0–34.0)
MCHC: 34.8 g/dL (ref 30.0–36.0)
MCV: 83.9 fL (ref 80.0–100.0)
Platelets: 255 10*3/uL (ref 150–400)
RBC: 3.73 MIL/uL — ABNORMAL LOW (ref 4.22–5.81)
RDW: 12.7 % (ref 11.5–15.5)
WBC: 13.2 10*3/uL — ABNORMAL HIGH (ref 4.0–10.5)
nRBC: 0 % (ref 0.0–0.2)

## 2019-05-21 MED ORDER — POTASSIUM CHLORIDE 10 MEQ/100ML IV SOLN
10.0000 meq | INTRAVENOUS | Status: AC
  Administered 2019-05-21 (×4): 10 meq via INTRAVENOUS
  Filled 2019-05-21 (×4): qty 100

## 2019-05-21 NOTE — Progress Notes (Signed)
[facility] SURGICAL PROGRESS NOTE (cpt [phone])  Hospital Day(s): 3.   Interval History: Patient states abdominal pain continues to improve.  No new fevers.  White blood cell count continues to improve.  Had mild nausea this AM when he first stood up.  Ambulating without issues.  Review of Systems:  Constitutional: + fever HEENT: denies cough or congestion  Respiratory: denies any shortness of breath  Cardiovascular: denies chest pain or palpitations  Gastrointestinal: + abdominal pain (improved), denied N/V, or diarrhea/and bowel function as per interval history Genitourinary: denies burning with urination or urinary frequency   Vital signs in last 24 hours: [min-max] current  Temp:  [97.9 F (36.6 C)-99.6 F (37.6 C)] 99.6 F (37.6 C) (12/20 0536) Pulse Rate:  [95-102] 102 (12/20 0536) Resp:  [16-18] 18 (12/20 0536) BP: (145-155)/(96-104) 145/98 (12/20 0536) SpO2:  [98 %] 98 % (12/20 0536)     Height: 5\' 8"  (172.7 cm) Weight: 99.8 kg BMI (Calculated): 33.46   Intake/Output last 2 shifts:  12/19 0701 - 12/20 0700 In: 3575.4 [I.V.:3423.3; IV Piggyback:152.2] Out: 150 [Urine:150]   Physical Exam:  Constitutional: alert, cooperative and no distress  HENT: normocephalic without obvious abnormality  Eyes: PERRL, EOM's grossly intact and symmetric  Respiratory: breathing non-labored at rest  Cardiovascular: tachycardic and sinus rhythm  Gastrointestinal: Mild tenderness in the lower abdomen, and non-distended. No rebound/guarding, no peritonitis Musculoskeletal: no edema or wounds, motor and sensation grossly intact, NT    Labs:  CBC Latest Ref Rng & Units 05/21/2019 05/20/2019 05/19/2019  WBC 4.0 - 10.5 K/uL 13.2(H) 14.9(H) 20.1(H)  Hemoglobin 13.0 - 17.0 g/dL 10.9(L) 11.8(L) 12.6(L)  Hematocrit 39.0 - 52.0 % 31.3(L) 34.8(L) 37.2(L)  Platelets 150 - 400 K/uL 255 230 242   CMP Latest Ref Rng & Units 05/21/2019 05/20/2019 05/19/2019  Glucose 70 - 99 mg/dL  05/21/2019) 751(W) 258(N)  BUN 6 - 20 mg/dL 16 15 17   Creatinine 0.61 - 1.24 mg/dL 277(O 2.42  Sodium 135 - 145 mmol/L 139 139 138  Potassium 3.5 - 5.1 mmol/L 3.3(L) 3.6 3.9  Chloride 98 - 111 mmol/L 107 107 104  CO2 22 - 32 mmol/L 22 19(L) 24  Calcium 8.9 - 10.3 mg/dL 8.1(L) 8.4(L) 8.6(L)  Total Protein 6.5 - 8.1 g/dL - - -  Total Bilirubin 0.3 - 1.2 mg/dL - - -  Alkaline Phos 38 - 126 U/L - - -  AST 15 - 41 U/L - - -  ALT 0 - 44 U/L - - -     Imaging studies:   KUB (05/19/2019) personally reviewed which does not show evidence of pneumoperitoneum, and radiologist report reviewed:  IMPRESSION: 1. Limited exam of the abdomen, only the upper abdomen imaged. No prominent bowel distention noted. No free air. No acute abnormality identified.  2.  Mild bibasilar subsegmental atelectasis.   Assessment/Plan: (ICD-10's: K62.20) 33 y.o. male with acute diverticulitis with contained perforation and early abscess vs phlegmon.  Friday, he was febrile and had an elevation in his white blood cell count.  He has had no new fevers and his leukocytosis is improving.  He remains tachycardic.  His abdominal examination is without peritonitis.   -White count improved and patient has defervesced, no need to reimage today, however if this changes, we may repeat imaging or proceed with Hartman's.  He was updated on plan and understands.     - NPO, except will permit a few ice chips for comfort  - Continue IV Abx (Zosyn) -->  Day 4  - Continue IVF resuscitation (150 ml/hr)  - pain control prn  - monitor abdominal examination  - Monitor leukocytosis; fever curve --> morning labs   - mobilize as tolerates             - medical management of any comorbid conditions             - DVT prophylaxis   All of the above findings and recommendations were discussed with the patient, and the medical team, and all of patient's questions were answered to his expressed satisfaction.

## 2019-05-22 ENCOUNTER — Inpatient Hospital Stay: Payer: Self-pay

## 2019-05-22 ENCOUNTER — Encounter: Payer: Self-pay | Admitting: Surgery

## 2019-05-22 LAB — CBC
HCT: 32.7 % — ABNORMAL LOW (ref 39.0–52.0)
Hemoglobin: 11 g/dL — ABNORMAL LOW (ref 13.0–17.0)
MCH: 29.5 pg (ref 26.0–34.0)
MCHC: 33.6 g/dL (ref 30.0–36.0)
MCV: 87.7 fL (ref 80.0–100.0)
Platelets: 274 10*3/uL (ref 150–400)
RBC: 3.73 MIL/uL — ABNORMAL LOW (ref 4.22–5.81)
RDW: 12.6 % (ref 11.5–15.5)
WBC: 12.2 10*3/uL — ABNORMAL HIGH (ref 4.0–10.5)
nRBC: 0 % (ref 0.0–0.2)

## 2019-05-22 LAB — BASIC METABOLIC PANEL
Anion gap: 11 (ref 5–15)
BUN: 15 mg/dL (ref 6–20)
CO2: 22 mmol/L (ref 22–32)
Calcium: 8.4 mg/dL — ABNORMAL LOW (ref 8.9–10.3)
Chloride: 108 mmol/L (ref 98–111)
Creatinine, Ser: 0.74 mg/dL (ref 0.61–1.24)
GFR calc Af Amer: >60 mL/min (ref >60)
GFR calc non Af Amer: >60 mL/min (ref >60)
Glucose, Bld: 100 mg/dL — ABNORMAL HIGH (ref 70–99)
Potassium: 3.6 mmol/L (ref 3.5–5.1)
Sodium: 141 mmol/L (ref 135–145)

## 2019-05-22 LAB — PHOSPHORUS: Phosphorus: 2.8 mg/dL (ref 2.5–4.6)

## 2019-05-22 LAB — MAGNESIUM: Magnesium: 1.9 mg/dL (ref 1.7–2.4)

## 2019-05-22 MED ORDER — IOHEXOL 300 MG/ML  SOLN
100.0000 mL | Freq: Once | INTRAMUSCULAR | Status: AC | PRN
  Administered 2019-05-22: 12:00:00 100 mL via INTRAVENOUS

## 2019-05-22 MED ORDER — IOHEXOL 9 MG/ML PO SOLN
500.0000 mL | ORAL | Status: AC
  Administered 2019-05-22 (×2): 500 mL via ORAL

## 2019-05-22 NOTE — Progress Notes (Signed)
[facility] SURGICAL PROGRESS NOTE (cpt [phone])  Hospital Day(s): 4.   Interval History: Patient seen and examined, no acute events or new complaints overnight. Patient reports he continues to feel much better. His abdominal pain has almost resolved. No chills, nausea, or emesis. However, he continues to run low grade intermittent fevers. Last fever was 100.5 yesterday. WBC had continued to improve over the weekend. He is very anxious to go home. No other complaints.   Review of Systems:  Constitutional: + fever, denied chills  HEENT: denies cough or congestion  Respiratory: denies any shortness of breath  Cardiovascular: denies chest pain or palpitations  Gastrointestinal: denies abdominal pain, N/V, or diarrhea/and bowel function as per interval history Genitourinary: denies burning with urination or urinary frequency  Vital signs in last 24 hours: [min-max] current  Temp:  [98.6 F (37 C)-100.5 F (38.1 C)] 99.8 F (37.7 C) (12/21 0602) Pulse Rate:  [88-94] 93 (12/21 0602) Resp:  [16-20] 18 (12/21 0602) BP: (144-158)/(93-113) 144/96 (12/21 0602) SpO2:  [95 %-97 %] 97 % (12/21 0602)     Height: 5\' 8"  (172.7 cm) Weight: 99.8 kg BMI (Calculated): 33.46   Intake/Output last 2 shifts:  12/20 0701 - 12/21 0700 In: 2715 [I.V.:2146.6; IV Piggyback:568.4] Out: 650 [Urine:650]   Physical Exam:  Constitutional: alert, cooperative and no distress  HENT: normocephalic without obvious abnormality  Eyes: PERRL, EOM's grossly intact and symmetric  Respiratory: breathing non-labored at rest  Cardiovascular: regular rate and sinus rhythm  Gastrointestinal: soft, non-tender, and non-distended. No rebound/guarding, no peritonitis Musculoskeletal: no edema or wounds, motor and sensation grossly intact, NT    Labs:  CBC Latest Ref Rng & Units 05/22/2019 05/21/2019 05/20/2019  WBC 4.0 - 10.5 K/uL 12.2(H) 13.2(H) 14.9(H)  Hemoglobin 13.0 - 17.0 g/dL 11.0(L) 10.9(L) 11.8(L)   Hematocrit 39.0 - 52.0 % 32.7(L) 31.3(L) 34.8(L)  Platelets 150 - 400 K/uL 274 255 230   CMP Latest Ref Rng & Units 05/22/2019 05/21/2019 05/20/2019  Glucose 70 - 99 mg/dL 100(H) 100(H) 114(H)  BUN 6 - 20 mg/dL 15 16 15   Creatinine 0.61 - 1.24 mg/dL 0.74 0.79 0.90  Sodium 135 - 145 mmol/L 141 139 139  Potassium 3.5 - 5.1 mmol/L 3.6 3.3(L) 3.6  Chloride 98 - 111 mmol/L 108 107 107  CO2 22 - 32 mmol/L 22 22 19(L)  Calcium 8.9 - 10.3 mg/dL 8.4(L) 8.1(L) 8.4(L)  Total Protein 6.5 - 8.1 g/dL - - -  Total Bilirubin 0.3 - 1.2 mg/dL - - -  Alkaline Phos 38 - 126 U/L - - -  AST 15 - 41 U/L - - -  ALT 0 - 44 U/L - - -     Imaging studies: No new pertinent imaging studies   Assessment/Plan: (ICD-10's: K33.20) 33 y.o. male with improving abdominal pain although he has remained intermittently febrile despite improved leukocytosis, attributable  acute diverticulitis with contained perforation and early abscess vs phlegmon   - Given his intermittent fevers despite clinical improve we will repeat CT Abdomen/Pelvis with contrast this morning to re-evaluate for abscess/worsening process.    - NPO             - Continue IV Abx (Zosyn) --> Day 4             - Continue IVF resuscitation (100 ml/hr)    - pain control prn             - monitor abdominal examination   - mobilize as tolerates -  medical management of any comorbid conditions - DVT prophylaxis     All of the above findings and recommendations were discussed with the patient, and the medical team, and all of patient's questions were answered to his expressed satisfaction.  -- Lynden Oxford, PA-C [facility] 05/22/2019, 9:04 AM [phone] M-F: 7am - 4pm

## 2019-05-23 ENCOUNTER — Inpatient Hospital Stay: Payer: Self-pay

## 2019-05-23 LAB — CBC
HCT: 34.7 % — ABNORMAL LOW (ref 39.0–52.0)
Hemoglobin: 11.6 g/dL — ABNORMAL LOW (ref 13.0–17.0)
MCH: 29 pg (ref 26.0–34.0)
MCHC: 33.4 g/dL (ref 30.0–36.0)
MCV: 86.8 fL (ref 80.0–100.0)
Platelets: 316 10*3/uL (ref 150–400)
RBC: 4 MIL/uL — ABNORMAL LOW (ref 4.22–5.81)
RDW: 12.6 % (ref 11.5–15.5)
WBC: 12.6 10*3/uL — ABNORMAL HIGH (ref 4.0–10.5)
nRBC: 0 % (ref 0.0–0.2)

## 2019-05-23 LAB — PROTIME-INR
INR: 1 (ref 0.8–1.2)
Prothrombin Time: 13 seconds (ref 11.4–15.2)

## 2019-05-23 LAB — APTT: aPTT: 33 seconds (ref 24–36)

## 2019-05-23 MED ORDER — ENOXAPARIN SODIUM 40 MG/0.4ML ~~LOC~~ SOLN
40.0000 mg | SUBCUTANEOUS | Status: DC
  Administered 2019-05-24 – 2019-05-29 (×6): 40 mg via SUBCUTANEOUS
  Filled 2019-05-23 (×6): qty 0.4

## 2019-05-23 MED ORDER — MIDAZOLAM HCL 5 MG/5ML IJ SOLN
INTRAMUSCULAR | Status: AC | PRN
  Administered 2019-05-23 (×3): 1 mg via INTRAVENOUS

## 2019-05-23 MED ORDER — SODIUM CHLORIDE 0.9% FLUSH
5.0000 mL | Freq: Three times a day (TID) | INTRAVENOUS | Status: DC
  Administered 2019-05-23 – 2019-05-29 (×18): 5 mL

## 2019-05-23 MED ORDER — HYDROCODONE-ACETAMINOPHEN 5-325 MG PO TABS: 1.0000 | ORAL_TABLET | ORAL | Status: DC | PRN

## 2019-05-23 MED ORDER — FENTANYL CITRATE (PF) 100 MCG/2ML IJ SOLN
INTRAMUSCULAR | Status: AC | PRN
  Administered 2019-05-23 (×3): 50 ug via INTRAVENOUS

## 2019-05-23 MED ORDER — FENTANYL CITRATE (PF) 100 MCG/2ML IJ SOLN
INTRAMUSCULAR | Status: AC
  Filled 2019-05-23: qty 4

## 2019-05-23 MED ORDER — MIDAZOLAM HCL 5 MG/5ML IJ SOLN
INTRAMUSCULAR | Status: AC
  Filled 2019-05-23: qty 5

## 2019-05-23 NOTE — Progress Notes (Signed)
Initial Nutrition Assessment  DOCUMENTATION CODES:   Obesity unspecified  INTERVENTION:   RD will monitor for diet advancement vs the need for nutrition support  Pt is at high refeed risk; recommend monitor K, Mg and P labs daily until stable once diet advanced  NUTRITION DIAGNOSIS:   Inadequate oral intake related to acute illness as evidenced by NPO status.  GOAL:   Patient will meet greater than or equal to 90% of their needs  MONITOR:   Diet advancement, Labs, Weight trends, Skin, I & O's  REASON FOR ASSESSMENT:   NPO/Clear Liquid Diet    ASSESSMENT:   33 y.o. male with acute diverticulitis with contained perforation and early abscess vs phlegmon.  RD working remotely.  Pt NPO x 6 days. Plan is for IR drain placement today and then diet advancement. Recommend supplements with diet advancement. Pt is at high refeed risk. No new weight since admit; will request weekly weights. There is not a very detailed weight history in chart but pt appears fairly weight stable since his last documented weight in September.   Medications reviewed and include: lovenox, zosyn  Labs reviewed: K 3.6 wnl, P 2.8 wnl, Mg 1.9 wnl Wbc- 12.6(H)  Unable to complete Nutrition-Focused physical exam at this time.   Diet Order:   Diet Order            Diet NPO time specified  Diet effective now              EDUCATION NEEDS:   No education needs have been identified at this time  Skin:  Skin Assessment: Reviewed RN Assessment  Last BM:  12/20 per RN documentation  Height:   Ht Readings from Last 1 Encounters:  05/18/19 5\' 8"  (1.727 m)    Weight:   Wt Readings from Last 1 Encounters:  05/18/19 99.8 kg    Ideal Body Weight:  70 kg  BMI:  Body mass index is 33.45 kg/m.  Estimated Nutritional Needs:   Kcal:  2100-2400kcal/day  Protein:  105-120g/day  Fluid:  >2.1L/day  Koleen Distance MS, RD, LDN Pager #- [phone] Office#- [phone] After Hours Pager:  [phone]

## 2019-05-23 NOTE — Consult Note (Signed)
[place] for post-IR anticoagulation timing Indication: VTE prophylaxis  No Known Allergies  Patient Measurements: Height: 5\' 8"  (172.7 cm) Weight: 220 lb (99.8 kg) IBW/kg (Calculated) : 68.4  Vital Signs: Temp: 98.6 F (37 C) (12/22 0516) Temp Source: Oral (12/22 0516) BP: 154/101 (12/22 1037) Pulse Rate: 91 (12/22 1037)  Labs: Recent Labs    05/21/19 0635 05/22/19 0447 05/23/19 0501  HGB 10.9* 11.0* 11.6*  HCT 31.3* 32.7* 34.7*  PLT 255 274 316  APTT  --   --  33  LABPROT  --   --  13.0  INR  --   --  1.0  CREATININE 0.79 0.74  --     Estimated Creatinine Clearance: 153.3 mL/min (by C-G formula based on SCr of 0.74 mg/dL).   Medical History: Past Medical History:  Diagnosis Date  . Diabetes mellitus without complication (HCC)     Medications:  Scheduled:  . acetaminophen  1,000 mg Oral Q6H  . enoxaparin (LOVENOX) injection  40 mg Subcutaneous Q24H  . fentaNYL      . influenza vac split quadrivalent PF  0.5 mL Intramuscular Tomorrow-1000  . midazolam      . sodium chloride flush  5 mL Intracatheter Q8H    Assessment: 33 y.o. male with improving abdominal pain although he has remained intermittently febrile despite improved leukocytosis, attributable acute diverticulitis with contained perforation and early abscess vs phlegmon s/p CT-guided drainage of a peritoneal abscess (standard bleeding risk). He is receiving LMWH prophylactically with last dose 12/21 2011  Goal of Therapy:  Monitor platelets by anticoagulation protocol: Yes   Plan:   Based on Post Procedure Management Guideline For Patients Having ELECTIVE Invasive Radiology Procedures the LMWH dose will be re-scheduled for 12/23 1000  Dale Schwartz 05/23/2019,10:52 AM

## 2019-05-23 NOTE — Progress Notes (Signed)
[facility] SURGICAL PROGRESS NOTE (cpt [phone])  Hospital Day(s): 5.   Interval History:  Patient seen and examined febrile to 101.8 overnight.  No abdominal pain, nausea, or emesis Leukocytosis stable (12.6k) S/P Drain placement with IR No other complaints  Review of Systems:  Constitutional: + fever, denied chills  HEENT: denies cough or congestion  Respiratory: denies any shortness of breath  Cardiovascular: denies chest pain or palpitations  Gastrointestinal: denies abdominal pain, N/V, or diarrhea/and bowel function as per interval history Genitourinary: denies burning with urination or urinary frequency  Vital signs in last 24 hours: [min-max] current  Temp:  [98.4 F (36.9 C)-101.8 F (38.8 C)] 99.2 F (37.3 C) (12/22 1202) Pulse Rate:  [78-96] 78 (12/22 1202) Resp:  [12-21] 20 (12/22 1202) BP: (136-158)/(89-106) 155/106 (12/22 1202) SpO2:  [94 %-100 %] 94 % (12/22 1202)     Height: 5\' 8"  (172.7 cm) Weight: 99.8 kg BMI (Calculated): 33.46   Intake/Output last 2 shifts:  12/21 0701 - 12/22 0700 In: 3371.6 [I.V.:3221.6; IV Piggyback:150] Out: 800 [Urine:800]   Physical Exam:  Constitutional: alert, cooperative and no distress  HENT: normocephalic without obvious abnormality  Eyes: PERRL, EOM's grossly intact and symmetric  Respiratory: breathing non-labored at rest  Cardiovascular: regular rate and sinus rhythm  Gastrointestinal: soft, non-tender, and non-distended. No rebound/guarding, no peritonitis. Percutaneous drain in the RLQ with purulent output Musculoskeletal: no edema or wounds, motor and sensation grossly intact, NT    Labs:  CBC Latest Ref Rng & Units 05/23/2019 05/22/2019 05/21/2019  WBC 4.0 - 10.5 K/uL 12.6(H) 12.2(H) 13.2(H)  Hemoglobin 13.0 - 17.0 g/dL 11.6(L) 11.0(L) 10.9(L)  Hematocrit 39.0 - 52.0 % 34.7(L) 32.7(L) 31.3(L)  Platelets 150 - 400 K/uL 316 274 255   CMP Latest Ref Rng & Units 05/22/2019 05/21/2019 05/20/2019   Glucose 70 - 99 mg/dL 100(H) 100(H) 114(H)  BUN 6 - 20 mg/dL 15 16 15   Creatinine 0.61 - 1.24 mg/dL 0.74 0.79 0.90  Sodium 135 - 145 mmol/L 141 139 139  Potassium 3.5 - 5.1 mmol/L 3.6 3.3(L) 3.6  Chloride 98 - 111 mmol/L 108 107 107  CO2 22 - 32 mmol/L 22 22 19(L)  Calcium 8.9 - 10.3 mg/dL 8.4(L) 8.1(L) 8.4(L)  Total Protein 6.5 - 8.1 g/dL - - -  Total Bilirubin 0.3 - 1.2 mg/dL - - -  Alkaline Phos 38 - 126 U/L - - -  AST 15 - 41 U/L - - -  ALT 0 - 44 U/L - - -     Imaging studies: No new pertinent imaging studies   Assessment/Plan: (ICD-10's: K39.20) 33 y.o. male still with intermittent fevers but stable improved leukocytosis and continued resolution of abdominal pain attributable to acute diverticulitis with contained perforation and early abscess vs phlegmon   - Advance diet as tolerates; wean from IVF   - Continue IV Abx (Zosyn) --> Day 5   - Monitor IR placed drain; we go home with this and need drain teaching   - pain control prn - monitor abdominal examination              - mobilize as tolerates - medical management of any comorbid conditions - DVT prophylaxis     - Discharge planning: If tolerates advancement of diet and remains stable without fever overnight then hopefully home in the next 24 hours.    All of the above findings and recommendations were discussed with the patient, and the medical team, and all of patient's questions were  answered to his expressed satisfaction.  -- Lynden Oxford, PA-C  [facility] 05/23/2019, 2:08 PM [phone] M-F: 7am - 4pm

## 2019-05-23 NOTE — Procedures (Signed)
  Procedure: CT RLQ 17f drain placement  EBL:   minimal Complications:  none immediate  See full dictation in [facility].  Dillard Cannon MD Main # [phone] Pager  [phone]

## 2019-05-24 ENCOUNTER — Inpatient Hospital Stay: Payer: Self-pay

## 2019-05-24 ENCOUNTER — Encounter: Payer: Self-pay | Admitting: Surgery

## 2019-05-24 LAB — CBC
HCT: 33.6 % — ABNORMAL LOW (ref 39.0–52.0)
Hemoglobin: 11.5 g/dL — ABNORMAL LOW (ref 13.0–17.0)
MCH: 29.4 pg (ref 26.0–34.0)
MCHC: 34.2 g/dL (ref 30.0–36.0)
MCV: 85.9 fL (ref 80.0–100.0)
Platelets: 344 10*3/uL (ref 150–400)
RBC: 3.91 MIL/uL — ABNORMAL LOW (ref 4.22–5.81)
RDW: 12.6 % (ref 11.5–15.5)
WBC: 17.3 10*3/uL — ABNORMAL HIGH (ref 4.0–10.5)
nRBC: 0 % (ref 0.0–0.2)

## 2019-05-24 MED ORDER — VANCOMYCIN HCL 2000 MG/400ML IV SOLN
2000.0000 mg | Freq: Once | INTRAVENOUS | Status: AC
  Administered 2019-05-24: 15:00:00 2000 mg via INTRAVENOUS
  Filled 2019-05-24: qty 400

## 2019-05-24 MED ORDER — ENSURE ENLIVE PO LIQD: 237.0000 mL | Freq: Three times a day (TID) | ORAL | Status: DC

## 2019-05-24 MED ORDER — IOHEXOL 300 MG/ML  SOLN
100.0000 mL | Freq: Once | INTRAMUSCULAR | Status: AC | PRN
  Administered 2019-05-24: 14:00:00 100 mL via INTRAVENOUS

## 2019-05-24 MED ORDER — VANCOMYCIN HCL IN DEXTROSE 1-5 GM/200ML-% IV SOLN
1000.0000 mg | Freq: Three times a day (TID) | INTRAVENOUS | Status: DC
  Administered 2019-05-25 – 2019-05-26 (×4): 1000 mg via INTRAVENOUS
  Filled 2019-05-24 (×7): qty 200

## 2019-05-24 MED ORDER — IOHEXOL 9 MG/ML PO SOLN: 1000.0000 mL | Freq: Once | ORAL | Status: DC | PRN

## 2019-05-24 NOTE — Progress Notes (Signed)
[facility] SURGICAL PROGRESS NOTE (cpt [phone])  Hospital Day(s): 6.    Interval History:  Patient seen and examined He continues to spike fevers, Tmax in last 24 hours 103.14F He denied any abdominal pain, nausea, or emesis Leukocytosis worsened today to 17K Drain with 100ccs out, however I am worried this appears more like stool No other new complaints.   Review of Systems:  Constitutional: + fever, chills  HEENT: denies cough or congestion  Respiratory: denies any shortness of breath  Cardiovascular: denies chest pain or palpitations  Gastrointestinal: denies abdominal pain, N/V, or diarrhea/and bowel function as per interval history Genitourinary: denies burning with urination or urinary frequency   Vital signs in last 24 hours: [min-max] current  Temp:  [98.3 F (36.8 C)-103.2 F (39.6 C)] 98.3 F (36.8 C) (12/23 0610) Pulse Rate:  [78-107] 107 (12/23 0610) Resp:  [17-20] 18 (12/23 0610) BP: (139-155)/(91-106) 139/94 (12/23 0610) SpO2:  [94 %-96 %] 96 % (12/23 0610)     Height: 5\' 8"  (172.7 cm) Weight: 99.8 kg BMI (Calculated): 33.46   Intake/Output last 2 shifts:  12/22 0701 - 12/23 0700 In: 636.6 [I.V.:572.2; IV Piggyback:59.4] Out: 800 [Urine:700; Drains:100]   Physical Exam:  Constitutional: alert, cooperative and no distress  HENT: normocephalic without obvious abnormality  Respiratory: breathing non-labored at rest  Cardiovascular: tachycardic and sinus rhythm  Gastrointestinal: soft, non-tender, and non-distended. No rebound/guarding, no peritonitis. Percutaneous drain in suprapubic region, output now appears more like stool Musculoskeletal: no edema or wounds, motor and sensation grossly intact, NT    Labs:  CBC Latest Ref Rng & Units 05/24/2019 05/23/2019 05/22/2019  WBC 4.0 - 10.5 K/uL 17.3(H) 12.6(H) 12.2(H)  Hemoglobin 13.0 - 17.0 g/dL 11.5(L) 11.6(L) 11.0(L)  Hematocrit 39.0 - 52.0 % 33.6(L) 34.7(L) 32.7(L)  Platelets 150 - 400 K/uL  344 316 274   CMP Latest Ref Rng & Units 05/22/2019 05/21/2019 05/20/2019  Glucose 70 - 99 mg/dL 100(H) 100(H) 114(H)  BUN 6 - 20 mg/dL 15 16 15   Creatinine 0.61 - 1.24 mg/dL 0.74 0.79 0.90  Sodium 135 - 145 mmol/L 141 139 139  Potassium 3.5 - 5.1 mmol/L 3.6 3.3(L) 3.6  Chloride 98 - 111 mmol/L 108 107 107  CO2 22 - 32 mmol/L 22 22 19(L)  Calcium 8.9 - 10.3 mg/dL 8.4(L) 8.1(L) 8.4(L)  Total Protein 6.5 - 8.1 g/dL - - -  Total Bilirubin 0.3 - 1.2 mg/dL - - -  Alkaline Phos 38 - 126 U/L - - -  AST 15 - 41 U/L - - -  ALT 0 - 44 U/L - - -     Imaging studies: No new pertinent imaging studies   Assessment/Plan: (ICD-10's: K48.20) 33 y.o. male with worsening leukocytosis and still with intermittent fevers (T-Max 103F) attributable to perforated diverticulitis with abscess s/p percutaneous drainage on 12/22, although I am concerned that the output appears more feculent today.    - Given his continued intermittent fevers, tachycardia, worsening leukocytosis, and change in the appearance of his drainage I am concerned that he is not responding appropriately to conservative management.     - Made NPO; restart IVF (NS 100 ml/hr)  - Continue IV Abx (Zosyn); added vancomycin today; follow up cultures and narrow (no growth as of now)  - pain control prn; antiemetics prn; antipyretics prn   - monitor abdominal examination; vitals; leukocytosis   - Unfortunately at this point he may benefit from Hartman's given lack of clinical improvement; he understands; decision/timing per Dr  Pabon   - medical management of comorbidities   All of the above findings and recommendations were discussed with the patient, and the medical team, and all of patient's questions were answered to his expressed satisfaction.  -- Lynden Oxford, PA-C [facility] 05/24/2019, 11:12 AM [phone] M-F: 7am - 4pm

## 2019-05-24 NOTE — Consult Note (Addendum)
Pharmacy Antibiotic Note  Dale Schwartz is a 33 y.o. male admitted on 05/18/2019 with perforated diverticulitis s/p percutaneous drainage on 12/22.  Pharmacy has been consulted for vancomycin dosing. Since admission he has been on Zosyn and continued to spike fevers, although his leukocytosis was improving. Today his WBC is again elevated with GPC (unspeciated) growing from a wound culture and noted feculant drainage. His renal function has been stable  Plan: 1) continue Zosyn 3.375g IV EI every 8 hours  2) Vancomycin 2000 mg x 1 then 1000 mg  IV Q 8 hrs Goal AUC 400-550 Expected AUC: 537 SCr used: 0.8 (rounded up) T1/2 6.2h Css: 33.8/15.5 mcg/mL BMP in am  Height: 5\' 8"  (172.7 cm) Weight: 220 lb (99.8 kg) IBW/kg (Calculated) : 68.4  Temp (24hrs), Avg:100.9 F (38.3 C), Min:98.3 F (36.8 C), Max:103.2 F (39.6 C)  Recent Labs  Lab 05/18/19 0728 05/19/19 0442 05/20/19 0507 05/21/19 0635 05/22/19 0447 05/23/19 0501 05/24/19 0938  WBC 15.7* 20.1* 14.9* 13.2* 12.2* 12.6* 17.3*  CREATININE 0.95 1.15 0.90 0.79 0.74  --   --     Estimated Creatinine Clearance: 153.3 mL/min (by C-G formula based on SCr of 0.74 mg/dL).    No Known Allergies  Antimicrobials this admission: Zosyn 12/17 >>  vancomycin 12/23 >>   Microbiology results: 12/22 WCx (diverticular abscess): mod GPC, few GNR, GNR  12/17 SARS CoV-2: negative   Thank you for allowing pharmacy to be a part of this patient's care.  Dallie Piles 05/24/2019 10:48 AM

## 2019-05-24 NOTE — TOC Initial Note (Signed)
Transition of Care [facility]) - Initial/Assessment Note    Patient Details  Name: Dale Schwartz MRN: [id number] Date of Birth: 06-26-1985  [facility]) CM/SW Contact:    Beverly Sessions, RN Phone Number: 05/24/2019, 3:36 PM  Clinical Narrative:                  Patient is uninsured.  RNCM following for medication needs at discharge        Patient Goals and CMS Choice        Expected Discharge Plan and Services                                                Prior Living Arrangements/Services                       Activities of Daily Living Home Assistive Devices/Equipment: None ADL Screening (condition at time of admission) Patient's cognitive ability adequate to safely complete daily activities?: Yes Is the patient deaf or have difficulty hearing?: No Does the patient have difficulty seeing, even when wearing glasses/contacts?: No Does the patient have difficulty concentrating, remembering, or making decisions?: No Patient able to express need for assistance with ADLs?: Yes Does the patient have difficulty dressing or bathing?: No Independently performs ADLs?: Yes (appropriate for developmental age) Does the patient have difficulty walking or climbing stairs?: No Weakness of Legs: None Weakness of Arms/Hands: None  Permission Sought/Granted                  Emotional Assessment              Admission diagnosis:  Diverticulitis of large intestine with perforation and abscess without bleeding [K57.20] Diverticulitis of large intestine with perforation without bleeding [K57.20] Patient Active Problem List   Diagnosis Date Noted  . Diverticulitis of large intestine with perforation without bleeding 05/18/2019   PCP:  Patient, No Pcp Per Pharmacy:  No Pharmacies Listed    Social Determinants of Health (SDOH) Interventions    Readmission Risk Interventions No flowsheet data found.

## 2019-05-25 LAB — CBC
HCT: 29.1 % — ABNORMAL LOW (ref 39.0–52.0)
Hemoglobin: 10.3 g/dL — ABNORMAL LOW (ref 13.0–17.0)
MCH: 29.4 pg (ref 26.0–34.0)
MCHC: 35.4 g/dL (ref 30.0–36.0)
MCV: 83.1 fL (ref 80.0–100.0)
Platelets: 353 10*3/uL (ref 150–400)
RBC: 3.5 MIL/uL — ABNORMAL LOW (ref 4.22–5.81)
RDW: 12.8 % (ref 11.5–15.5)
WBC: 15.1 10*3/uL — ABNORMAL HIGH (ref 4.0–10.5)
nRBC: 0 % (ref 0.0–0.2)

## 2019-05-25 LAB — BASIC METABOLIC PANEL
Anion gap: 9 (ref 5–15)
BUN: 14 mg/dL (ref 6–20)
CO2: 26 mmol/L (ref 22–32)
Calcium: 8.2 mg/dL — ABNORMAL LOW (ref 8.9–10.3)
Chloride: 102 mmol/L (ref 98–111)
Creatinine, Ser: 0.74 mg/dL (ref 0.61–1.24)
GFR calc Af Amer: >60 mL/min (ref >60)
GFR calc non Af Amer: >60 mL/min (ref >60)
Glucose, Bld: 106 mg/dL — ABNORMAL HIGH (ref 70–99)
Potassium: 3.5 mmol/L (ref 3.5–5.1)
Sodium: 137 mmol/L (ref 135–145)

## 2019-05-25 LAB — MAGNESIUM: Magnesium: 2.2 mg/dL (ref 1.7–2.4)

## 2019-05-25 LAB — PHOSPHORUS: Phosphorus: 3.2 mg/dL (ref 2.5–4.6)

## 2019-05-25 MED ORDER — TRACE MINERALS CU-MN-SE-ZN 300-55-60-3000 MCG/ML IV SOLN
INTRAVENOUS | Status: DC
  Filled 2019-05-25: qty 960

## 2019-05-25 MED ORDER — SODIUM CHLORIDE 0.9% FLUSH: 10.0000 mL | INTRAVENOUS | Status: DC | PRN

## 2019-05-25 MED ORDER — CHLORHEXIDINE GLUCONATE CLOTH 2 % EX PADS
6.0000 | MEDICATED_PAD | Freq: Every day | CUTANEOUS | Status: DC
  Administered 2019-05-26 – 2019-05-29 (×4): 6 via TOPICAL

## 2019-05-25 MED ORDER — SODIUM CHLORIDE 0.9% FLUSH
10.0000 mL | Freq: Two times a day (BID) | INTRAVENOUS | Status: DC
  Administered 2019-05-25 – 2019-05-29 (×3): 10 mL

## 2019-05-25 MED ORDER — SODIUM CHLORIDE 0.9 % IV SOLN: INTRAVENOUS | Status: AC

## 2019-05-25 MED ORDER — INSULIN ASPART 100 UNIT/ML ~~LOC~~ SOLN: 0.0000 [IU] | Freq: Four times a day (QID) | SUBCUTANEOUS | Status: DC

## 2019-05-25 MED ORDER — SODIUM CHLORIDE 0.9 % IV SOLN: INTRAVENOUS | Status: DC

## 2019-05-25 MED ORDER — INSULIN ASPART 100 UNIT/ML ~~LOC~~ SOLN
0.0000 [IU] | Freq: Four times a day (QID) | SUBCUTANEOUS | Status: DC
  Administered 2019-05-27 – 2019-05-28 (×3): 1 [IU] via SUBCUTANEOUS
  Filled 2019-05-25 (×3): qty 1

## 2019-05-25 MED ORDER — FAT EMULSION PLANT BASED 20 % IV EMUL
250.0000 mL | INTRAVENOUS | Status: AC
  Administered 2019-05-25: 250 mL via INTRAVENOUS
  Filled 2019-05-25: qty 250

## 2019-05-25 MED ORDER — CHLORHEXIDINE GLUCONATE CLOTH 2 % EX PADS: 6.0000 | MEDICATED_PAD | Freq: Every day | CUTANEOUS | Status: DC

## 2019-05-25 NOTE — Consult Note (Signed)
Pharmacy Antibiotic Note  Dale Schwartz is a 33 y.o. male admitted on 05/18/2019 with perforated diverticulitis s/p percutaneous drainage on 12/22.  Pharmacy has been consulted for vancomycin dosing. Since admission he has been on Zosyn and continued to spike fevers, although his leukocytosis was improving. Today his WBC is again elevated with GPC (unspeciated) growing from a wound culture and noted feculant drainage. His renal function has been stable  Plan: 1) continue Zosyn 3.375g IV EI every 8 hours  2) Vancomycin 2000 mg x 1 then 1000 mg  IV Q 8 hrs Goal AUC 400-550 Expected AUC: 537 SCr used: 0.8 (rounded up) T1/2 6.2h Css: 33.8/15.5 mcg/mL BMP in am  Height: 5\' 8"  (172.7 cm) Weight: 220 lb (99.8 kg) IBW/kg (Calculated) : 68.4  Temp (24hrs), Avg:99.5 F (37.5 C), Min:99.3 F (37.4 C), Max:99.8 F (37.7 C)  Recent Labs  Lab 05/19/19 0442 05/20/19 0507 05/21/19 0635 05/22/19 0447 05/23/19 0501 05/24/19 0938 05/25/19 0422  WBC 20.1* 14.9* 13.2* 12.2* 12.6* 17.3* 15.1*  CREATININE 1.15 0.90 0.79 0.74  --   --  0.74    Estimated Creatinine Clearance: 153.3 mL/min (by C-G formula based on SCr of 0.74 mg/dL).    No Known Allergies  Antimicrobials this admission: Zosyn 12/17 >>  vancomycin 12/23 >>   Microbiology results: 12/22 WCx (diverticular abscess): mod GPC, few GNR, GNR = E.coli pan sensitive 12/17 SARS CoV-2: negative   Thank you for allowing pharmacy to be a part of this patient's care.  Kasmira Cacioppo A 05/25/2019 11:36 AM

## 2019-05-25 NOTE — Progress Notes (Signed)
Peripherally Inserted Central Catheter/Midline Placement  The IV Nurse has discussed with the patient and/or persons authorized to consent for the patient, the purpose of this procedure and the potential benefits and risks involved with this procedure.  The benefits include less needle sticks, lab draws from the catheter, and the patient may be discharged home with the catheter. Risks include, but not limited to, infection, bleeding, blood clot (thrombus formation), and puncture of an artery; nerve damage and irregular heartbeat and possibility to perform a PICC exchange if needed/ordered by physician.  Alternatives to this procedure were also discussed.  Bard Power PICC patient education guide, fact sheet on infection prevention and patient information card has been provided to patient /or left at bedside.    PICC/Midline Placement Documentation  PICC Double Lumen 03/88/82 PICC Right Basilic 41 cm 0 cm (Active)  Indication for Insertion or Continuance of Line Administration of hyperosmolar/irritating solutions (i.e. TPN, Vancomycin, etc.) 05/25/19 1200  Exposed Catheter (cm) 0 cm 05/25/19 1200  Site Assessment Clean;Dry;Intact 05/25/19 1200  Lumen #1 Status Flushed;Saline locked;Blood return noted 05/25/19 1200  Lumen #2 Status Flushed;Saline locked;Blood return noted 05/25/19 1200  Dressing Type Transparent;Securing device 05/25/19 1200  Dressing Status Clean;Dry;Intact;Antimicrobial disc in place 05/25/19 1200  Line Care Connections checked and tightened 05/25/19 1200  Dressing Intervention New dressing;Other (Comment) 05/25/19 1200  Dressing Change Due 06/01/19 05/25/19 1200   Written consent obtained from patient with Spanish translator #[id number]    Dale Schwartz 05/25/2019, 12:16 PM

## 2019-05-25 NOTE — Progress Notes (Signed)
Nutrition Follow Up Note   DOCUMENTATION CODES:   Obesity unspecified  INTERVENTION:    Initiate Clinimix 5/15 with electrolytes at 56ml/hr x 24 hrs (Goal rate 31ml/hr once labs stable)  Initiate 20% lipids @ 249ml daily x 12 hours   Regimen @ goal rate provides 1914kcal/day, 100g/day protein, 2259ml volume    Add MVI per shortage recommendations and trace elements daily    Add IV thiamine 100mg  daily x 3 days   Pt at high refeeding risk; recommend check P, K, and Mg daily for 3 days after TPN iniatition.    Daily weights   Keep total IVF + TPN rate at 16ml/hr per MD  NUTRITION DIAGNOSIS:   Inadequate oral intake related to acute illness as evidenced by NPO status.  GOAL:   Patient will meet greater than or equal to 90% of their needs  -progressing with TPN  MONITOR:   Diet advancement, Labs, Weight trends, Skin, I & O's, TPN  REASON FOR ASSESSMENT:   Consult New TPN/TNA  ASSESSMENT:   33 y.o. male with acute diverticulitis with contained perforation and early abscess vs phlegmon.  Pt s/p CT guided drainage catheter 12/22  Pt NPO x 8 days. Plan is for PICC line and TPN today. Pt is at high refeed risk. Drain with 57ml output. Pt being evaluated for possible Hartmann's. No new weight since admit; will request daily weights.   Medications reviewed and include: lovenox, zosyn, vancomycin, NaCl @100ml /hr  Labs reviewed: K 3.5 wnl, P 3.2 wnl, Mg 2.2 wnl Wbc- 15.1(H), Hgb 10.3(L), Hct 29.1(L)  Diet Order:   Diet Order            Diet NPO time specified  Diet effective now             EDUCATION NEEDS:   No education needs have been identified at this time  Skin:  Skin Assessment: Reviewed RN Assessment  Last BM:  12/22  Height:   Ht Readings from Last 1 Encounters:  05/18/19 5\' 8"  (1.727 m)    Weight:   Wt Readings from Last 1 Encounters:  05/18/19 99.8 kg    Ideal Body Weight:  70 kg  BMI:  Body mass index is 33.45 kg/m.  Estimated  Nutritional Needs:   Kcal:  2100-2400kcal/day  Protein:  105-120g/day  Fluid:  >2.1L/day  Koleen Distance MS, RD, LDN Pager #- [phone] Office#- [phone] After Hours Pager: [phone]

## 2019-05-25 NOTE — Progress Notes (Signed)
[facility] SURGICAL PROGRESS NOTE (cpt [phone])  Hospital Day(s): 7.    Interval History:  Patient seen and examined Afebrile the past 24 hours. He denied any abdominal pain, nausea, or emesis Leukocytosis improved Drain with 45 ccs out, purulent. No other new complaints; he states that he continues to feel better each day.   Review of Systems:  Constitutional: + fever, chills  HEENT: denies cough or congestion  Respiratory: denies any shortness of breath  Cardiovascular: denies chest pain or palpitations  Gastrointestinal: denies abdominal pain, N/V, or diarrhea/and bowel function as per interval history Genitourinary: denies burning with urination or urinary frequency   Vital signs in last 24 hours: [min-max] current  Temp:  [99.3 F (37.4 C)-99.8 F (37.7 C)] 99.8 F (37.7 C) (12/24 0535) Pulse Rate:  [89-101] 93 (12/24 0535) Resp:  [16] 16 (12/24 0535) BP: (140-143)/(89-91) 143/91 (12/24 0535) SpO2:  [94 %-98 %] 97 % (12/24 0535)     Height: 5\' 8"  (172.7 cm) Weight: 99.8 kg BMI (Calculated): 33.46   Intake/Output last 2 shifts:  12/23 0701 - 12/24 0700 In: 2610.3 [I.V.:2082.2; IV ASTMHDQQI:297] Out: 989 [Urine:600; Drains:45]   Physical Exam:  Constitutional: alert, cooperative and no distress  HENT: normocephalic without obvious abnormality  Respiratory: breathing non-labored at rest  Cardiovascular: tachycardic and sinus rhythm  Gastrointestinal: soft, non-tender, and non-distended. No rebound/guarding, no peritonitis. Percutaneous drain in suprapubic region, output is purulent/fecopurulent. Musculoskeletal: no edema or wounds, motor and sensation grossly intact, NT    Labs:  CBC Latest Ref Rng & Units 05/25/2019 05/24/2019 05/23/2019  WBC 4.0 - 10.5 K/uL 15.1(H) 17.3(H) 12.6(H)  Hemoglobin 13.0 - 17.0 g/dL 10.3(L) 11.5(L) 11.6(L)  Hematocrit 39.0 - 52.0 % 29.1(L) 33.6(L) 34.7(L)  Platelets 150 - 400 K/uL 353 344 316   CMP Latest Ref Rng &  Units 05/25/2019 05/22/2019 05/21/2019  Glucose 70 - 99 mg/dL 106(H) 100(H) 100(H)  BUN 6 - 20 mg/dL 14 15 16   Creatinine 0.61 - 1.24 mg/dL 0.74 0.74 0.79  Sodium 135 - 145 mmol/L 137 141 139  Potassium 3.5 - 5.1 mmol/L 3.5 3.6 3.3(L)  Chloride 98 - 111 mmol/L 102 108 107  CO2 22 - 32 mmol/L 26 22 22   Calcium 8.9 - 10.3 mg/dL 8.2(L) 8.4(L) 8.1(L)  Total Protein 6.5 - 8.1 g/dL - - -  Total Bilirubin 0.3 - 1.2 mg/dL - - -  Alkaline Phos 38 - 126 U/L - - -  AST 15 - 41 U/L - - -  ALT 0 - 44 U/L - - -     Imaging studies: No new pertinent imaging studies   Assessment/Plan: (ICD-10's: K2.20) 33 y.o. male with perforated diverticulitis with abscess s/p percutaneous drainage on 12/22. Despite the appearance of the drain output, his WBC is improving and he has been afebrile for the past 24 hours. He very much wants to avoid a colostomy; he works in [employer].    - Continue IV Abx (Zosyn); vancomycin added yesterday; follow up cultures and narrow (E. Coli, sensitivities pending)  - pain control prn; antiemetics prn; antipyretics prn   - Continue NPO; getting PICC line for TPN  - monitor abdominal examination; vitals; leukocytosis   - He understands that he may still require a Hartmann procedure  - medical management of comorbidities

## 2019-05-25 NOTE — Progress Notes (Signed)
PHARMACY - TOTAL PARENTERAL NUTRITION CONSULT NOTE   Indication: Prolonged ileus, perforated diverticulitis with abscess s/p percutaneous drainage on 12/22,  Patient Measurements: Height: 5\' 8"  (172.7 cm) Weight: 220 lb (99.8 kg) IBW/kg (Calculated) : 68.4 TPN AdjBW (KG): 76.2 Body mass index is 33.45 kg/m. Usual Weight:    Assessment:   Glucose / Insulin: SSI q6h Electrolytes: WNL Renal: Scr 0.74 LFTs / TGs: in am Prealbumin / albumin:  Intake / Output; MIVF:  GI Imaging: Surgeries / Procedures: perforated diverticulitis with abscess s/p percutaneous drainage on 12/22,   Central access:  TPN start date: 12/24  Nutritional Goals (per RD recommendation on 12/24): Goal TPN rate is 83 mL/hr (Regimen @ goal rate provides 1914kcal/day, 100g/day protein, 2247ml volume )  Current Nutrition:  NPO  Plan:  Start TPN Clinimix E 5/15 at 8mL/hr at 1800 (goal rate will be 83 ml/hr). MVI, trace and Thiamine added to TPN. MVI on shortage and will be added to TPN on Mondays and Thursdays. Thiamine x 3 days- started 12/24 -Initiate 20% lipids @ 263ml daily x 12 hours -Pt at high refeeding risk per dietician; recommend check P, K, and Mg daily for 3 days after TPN initation. Monitor regular TPN labs on Mon/Thurs.  -Initiate Sensitive q6h SSI and adjust as needed  -Reduce MIVF to 60 mL/hr at 1800 (total IVF + TPN to = 100 ml/hr -may need further surgery per note 12/23   Andriana Casa A 05/25/2019,11:16 AM

## 2019-05-26 ENCOUNTER — Inpatient Hospital Stay: Payer: Self-pay

## 2019-05-26 LAB — CBC
HCT: 27.8 % — ABNORMAL LOW (ref 39.0–52.0)
Hemoglobin: 9.3 g/dL — ABNORMAL LOW (ref 13.0–17.0)
MCH: 28.9 pg (ref 26.0–34.0)
MCHC: 33.5 g/dL (ref 30.0–36.0)
MCV: 86.3 fL (ref 80.0–100.0)
Platelets: 390 10*3/uL (ref 150–400)
RBC: 3.22 MIL/uL — ABNORMAL LOW (ref 4.22–5.81)
RDW: 12.5 % (ref 11.5–15.5)
WBC: 18.9 10*3/uL — ABNORMAL HIGH (ref 4.0–10.5)
nRBC: 0 % (ref 0.0–0.2)

## 2019-05-26 LAB — COMPREHENSIVE METABOLIC PANEL
ALT: 39 U/L (ref 0–44)
AST: 38 U/L (ref 15–41)
Albumin: 2.7 g/dL — ABNORMAL LOW (ref 3.5–5.0)
Alkaline Phosphatase: 62 U/L (ref 38–126)
Anion gap: 9 (ref 5–15)
BUN: 14 mg/dL (ref 6–20)
CO2: 23 mmol/L (ref 22–32)
Calcium: 8.2 mg/dL — ABNORMAL LOW (ref 8.9–10.3)
Chloride: 102 mmol/L (ref 98–111)
Creatinine, Ser: 0.58 mg/dL — ABNORMAL LOW (ref 0.61–1.24)
GFR calc Af Amer: >60 mL/min (ref >60)
GFR calc non Af Amer: >60 mL/min (ref >60)
Glucose, Bld: 121 mg/dL — ABNORMAL HIGH (ref 70–99)
Potassium: 3.5 mmol/L (ref 3.5–5.1)
Sodium: 134 mmol/L — ABNORMAL LOW (ref 135–145)
Total Bilirubin: 0.8 mg/dL (ref 0.3–1.2)
Total Protein: 6.4 g/dL — ABNORMAL LOW (ref 6.5–8.1)

## 2019-05-26 LAB — GLUCOSE, CAPILLARY
Glucose-Capillary: 112 mg/dL — ABNORMAL HIGH (ref 70–99)
Glucose-Capillary: 102 mg/dL — ABNORMAL HIGH (ref 70–99)
Glucose-Capillary: 123 mg/dL — ABNORMAL HIGH (ref 70–99)

## 2019-05-26 LAB — DIFFERENTIAL
Abs Immature Granulocytes: 0.36 10*3/uL — ABNORMAL HIGH (ref 0.00–0.07)
Basophils Absolute: 0.1 10*3/uL (ref 0.0–0.1)
Basophils Relative: 0 %
Eosinophils Absolute: 0.1 10*3/uL (ref 0.0–0.5)
Eosinophils Relative: 1 %
Immature Granulocytes: 2 %
Lymphocytes Relative: 15 %
Lymphs Abs: 2.8 10*3/uL (ref 0.7–4.0)
Monocytes Absolute: 1 10*3/uL (ref 0.1–1.0)
Monocytes Relative: 5 %
Neutro Abs: 14.7 10*3/uL — ABNORMAL HIGH (ref 1.7–7.7)
Neutrophils Relative %: 77 %
Smear Review: NORMAL

## 2019-05-26 LAB — PHOSPHORUS: Phosphorus: 3.5 mg/dL (ref 2.5–4.6)

## 2019-05-26 LAB — PREALBUMIN: Prealbumin: 11.9 mg/dL — ABNORMAL LOW (ref 18–38)

## 2019-05-26 LAB — TRIGLYCERIDES: Triglycerides: 192 mg/dL — ABNORMAL HIGH (ref <150)

## 2019-05-26 LAB — MAGNESIUM: Magnesium: 2 mg/dL (ref 1.7–2.4)

## 2019-05-26 MED ORDER — TRACE MINERALS CU-MN-SE-ZN 300-55-60-3000 MCG/ML IV SOLN: INTRAVENOUS | Status: DC

## 2019-05-26 MED ORDER — FAT EMULSION PLANT BASED 20 % IV EMUL
250.0000 mL | INTRAVENOUS | Status: AC
  Administered 2019-05-26: 250 mL via INTRAVENOUS
  Filled 2019-05-26: qty 250

## 2019-05-26 MED ORDER — SODIUM CHLORIDE 0.9 % IV SOLN: INTRAVENOUS | Status: DC

## 2019-05-26 MED ORDER — BOOST / RESOURCE BREEZE PO LIQD CUSTOM
1.0000 | Freq: Three times a day (TID) | ORAL | Status: DC
  Administered 2019-05-26 – 2019-05-29 (×8): 1 via ORAL

## 2019-05-26 MED ORDER — TRACE MINERALS CU-MN-SE-ZN 300-55-60-3000 MCG/ML IV SOLN
INTRAVENOUS | Status: AC
  Filled 2019-05-26: qty 1992

## 2019-05-26 MED ORDER — TRACE MINERALS CU-MN-SE-ZN 300-55-60-3000 MCG/ML IV SOLN
40.0000 mL/h | INTRAVENOUS | Status: AC
  Filled 2019-05-26: qty 1992

## 2019-05-26 NOTE — Consult Note (Addendum)
Pharmacy Antibiotic Note  Dale Schwartz is a 33 y.o. male admitted on 05/18/2019 with perforated diverticulitis s/p percutaneous drainage on 12/22.  Pharmacy has been consulted for vancomycin dosing. Since admission he has been on Zosyn and continued to spike fevers, although his leukocytosis was improving. His renal function has been stable  Plan: 1) continue Zosyn 3.375g IV EI every 8 hours  2) The abscess culture is growing MODERATE ESCHERICHIA COLI, MODERATE VIRIDANS STREPTOCOCCUS. Will d/c vancomycin.     Height: 5\' 8"  (172.7 cm) Weight: 220 lb (99.8 kg) IBW/kg (Calculated) : 68.4  Temp (24hrs), Avg:99 F (37.2 C), Min:99 F (37.2 C), Max:99 F (37.2 C)  Recent Labs  Lab 05/20/19 0507 05/21/19 0635 05/22/19 0447 05/23/19 0501 05/24/19 0938 05/25/19 0422 05/26/19 0451  WBC 14.9* 13.2* 12.2* 12.6* 17.3* 15.1* 18.9*  CREATININE 0.90 0.79 0.74  --   --  0.74 0.58*    Estimated Creatinine Clearance: 153.3 mL/min (A) (by C-G formula based on SCr of 0.58 mg/dL (L)).    No Known Allergies  Antimicrobials this admission: Zosyn 12/17 >>  vancomycin 12/23 >> 12/25  Microbiology results: 12/22 WCx  MODERATE ESCHERICHIA COLI, MODERATE VIRIDANS STREPTOCOCCUS.  12/17 SARS CoV-2: negative   Thank you for allowing pharmacy to be a part of this patient's care.  Oswald Hillock, PharmD, BCPS 05/26/2019 9:01 AM

## 2019-05-26 NOTE — Progress Notes (Signed)
CC: Perforated diverticulitis Subjective: 33 year old male with a complex perforated diverticulitis status post drain placement.  He was febrile 48 hours ago but he has defervesced since.  I have repeated a KUB this morning that have personally reviewed.  The small amount of free air previously seen is completely gone.  More importantly he has continued to remain afebrile and his abdominal pain has subsided.  His tachycardia has improved significantly however his white count did go up today to 18.9. 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   Objective: Vital signs in last 24 hours: Temp:  [98.8 F (37.1 C)-99 F (37.2 C)] 98.8 F (37.1 C) (12/25 1156) Pulse Rate:  [87-101] 87 (12/25 1156) Resp:  [18-22] 22 (12/25 1156) BP: (135-143)/(88-107) 135/88 (12/25 1156) SpO2:  [99 %-100 %] 100 % (12/25 1156) Last BM Date: 05/23/19  Intake/Output from previous day: 12/24 0701 - 12/25 0700 In: 3065.2 [I.V.:2438.8; IV Piggyback:626.4] Out: 930 [Urine:900; Drains:30] Intake/Output this shift: No intake/output data recorded.  Physical exam:  NAD, alert, obese Abd: soft, NT, drain in place w brown liquid. No peritonitis, no tenderness  Lab Results: CBC  Recent Labs    05/25/19 0422 05/26/19 0451  WBC 15.1* 18.9*  HGB 10.3* 9.3*  HCT 29.1* 27.8*  PLT 353 390   BMET Recent Labs    05/25/19 0422 05/26/19 0451  NA 137 134*  K 3.5 3.5  CL 102 102  CO2 26 23  GLUCOSE 106* 121*  BUN 14 14  CREATININE 0.74 0.58*  CALCIUM 8.2* 8.2*   PT/INR No results for input(s): LABPROT, INR in the last 72 hours. ABG No results for input(s): PHART, HCO3 in the last 72 hours.  Invalid input(s): PCO2, PO2  Studies/Results: CT ABDOMEN PELVIS W CONTRAST  Addendum Date: 05/24/2019   ADDENDUM REPORT: 05/24/2019 14:32 ADDENDUM: These results were called by telephone at the time of interpretation on 05/24/2019 at 2:32 pm to provider Shereen Marton , who verbally acknowledged  these results. Electronically Signed   By: Donzetta Kohut M.D.   On: 05/24/2019 14:32   Result Date: 05/24/2019 CLINICAL DATA:  Diverticulitis. Persistent fevers after percutaneous drainage of abscess. EXAM: CT ABDOMEN AND PELVIS WITH CONTRAST TECHNIQUE: Multidetector CT imaging of the abdomen and pelvis was performed using the standard protocol following bolus administration of intravenous contrast. CONTRAST:  OMNIPAQUE IOHEXOL 300 MG/ML  SOLN COMPARISON:  Is hepatic 05/22/2019 FINDINGS: Lower chest: Basilar atelectasis. Small right effusion. Hepatobiliary: Signs of hepatic steatosis liver is enlarged measuring 22 cm in craniocaudal dimension. Lobular hepatic contours. Similar to prior study. Gallbladder is normal. Portal vein and SMV remain patent. Pancreas: Pancreas is normal. No signs of peripancreatic inflammation or ductal dilation. Spleen: Spleen is normal. Adrenals/Urinary Tract: Adrenal glands are normal. Kidneys enhance symmetrically without focal lesion. No signs of hydronephrosis. Mild thickening of the urinary bladder may relate to adjacent inflammation. Stomach/Bowel: Signs of marked enteritis in the setting of presumed perforated colonic diverticulum. The appendix is not visualized. There is a catheter in the right lower quadrant, now with evacuation of the gas collection that was seen in this location on the prior study. No signs of undrained fluid collection. Slight increase in pneumoperitoneum when compared to the previous exam. Area along the sigmoid colon with small gas containing structure with peripheral soft tissue presumably the site of perforation measuring 2.2 x 1.7 cm (image 76, series 2. Vascular/Lymphatic: Vascular structures in the abdomen are patent. No signs of retroperitoneal adenopathy. Scattered mesenteric  lymph nodes. No signs of pelvic lymphadenopathy. Reproductive: Prostate is unremarkable by CT. Other: Slight increase in pneumoperitoneum since the previous study with  of Accu a shin of gas and small fluid in the right lower quadrant presumably the site of colonic perforation. Mild omental stranding along the right flank in thickening of lateral conal fascia. Musculoskeletal: No acute or destructive bone process. IMPRESSION: 1. Evacuation of fluid and gas via percutaneous catheter placement at the site of presumed perforated colonic diverticulum with slight increase in pneumoperitoneum and generalized mesenteric and omental stranding since the prior study. Some of this may represent redistribution of gas that was present previously. The possibility of more diffuse inflammatory changes/peritonitis related to this perforation is also considered. There is no undrained fluid collection. 2. Presumed secondary inflammation of the urinary bladder. 3. Hepatic steatosis with hepatomegaly. 4. These results will be called to the ordering clinician or representative by the Radiologist Assistant, and communication documented in the PACS or zVision Dashboard. Is Electronically Signed: By: Donzetta KohutGeoffrey  Wile M.D. On: 05/24/2019 14:15   DG Abd Portable 2V  Result Date: 05/26/2019 CLINICAL DATA:  Perforated diverticulitis with abscess post drainage 05/23/2019. EXAM: PORTABLE ABDOMEN - 2 VIEW COMPARISON:  05/24/2019 FINDINGS: Pigtail drainage catheter is present over the right lower quadrant unchanged. Bowel gas pattern is nonobstructive. There are a few air-filled nondilated large and small bowel loops. There is no free peritoneal air. Remainder of the exam is unchanged. IMPRESSION: Nonobstructive bowel gas pattern. Pigtail drainage catheter over the right lower quadrant unchanged. Electronically Signed   By: Elberta Fortisaniel  Boyle M.D.   On: 05/26/2019 09:18   DG Abd Portable 2V  Result Date: 05/24/2019 CLINICAL DATA:  Recent diverticulitis with abscess EXAM: PORTABLE ABDOMEN - 2 VIEW COMPARISON:  May 19, 2019 FINDINGS: Supine and upright images were obtained. A drain is noted in the right  pelvis region. There are multiple air-fluid levels without appreciable bowel dilatation. There is trace free air evident. Visualized lung bases are clear. IMPRESSION: Multiple air-fluid levels. Suggest ileus or inflammatory etiology such as enteritis. Bowel obstruction felt to be less likely. Surgical drain present in right pelvis. Trace free air may well be secondary to recent drainage placement. Lung bases clear. Electronically Signed   By: Bretta BangWilliam  Woodruff III M.D.   On: 05/24/2019 12:48   US EKG SITE RITE  Result Date: 05/24/2019 If Site Rite image not attached, placement could not be confirmed due to current cardiac rhythm.   Anti-infectives: Anti-infectives (From admission, onward)   Start     Dose/Rate Route Frequency Ordered Stop   05/25/19 0000  vancomycin (VANCOCIN) IVPB 1000 mg/200 mL premix  Status:  Discontinued     1,000 mg 200 mL/hr over 60 Minutes Intravenous Every 8 hours 05/24/19 1410 05/26/19 0918   05/24/19 1300  vancomycin (VANCOREADY) IVPB 2000 mg/400 mL     2,000 mg 200 mL/hr over 120 Minutes Intravenous  Once 05/24/19 1049 05/24/19 1653   05/18/19 2200  piperacillin-tazobactam (ZOSYN) IVPB 3.375 g     3.375 g 12.5 mL/hr over 240 Minutes Intravenous Every 8 hours 05/18/19 1151     05/18/19 1100  piperacillin-tazobactam (ZOSYN) IVPB 3.375 g  Status:  Discontinued     3.375 g 12.5 mL/hr over 240 Minutes Intravenous Every 8 hours 05/18/19 1051 05/18/19 1152   05/18/19 0915  piperacillin-tazobactam (ZOSYN) IVPB 3.375 g     3.375 g 100 mL/hr over 30 Minutes Intravenous  Once 05/18/19 0911 05/18/19 1038      Assessment/Plan:  Perforated diverticulitis with abscess.  Clinically improving.  The only lab that has worsened is the white count.  Once again he clinically continues to improve he has defervesced and his tachycardia has resolved.  There is no need for emergent surgical intervention.  He understands that he will benefit from elective sigmoid colectomy.  If his  white count does not improve we will likely need to rescan him to assess for any other undrained collections.  We will start clear liquid diet and continue TPN.  Currently we will also stop vancomycin and continue Zosyn.  Groin E. coli and a streptococci. I have spent > 35 minutes in this encounter with greater than 50% spent in coordination and counseling of his care  Caroleen Hamman, MD, [facility]  05/26/2019

## 2019-05-26 NOTE — Progress Notes (Signed)
PHARMACY - TOTAL PARENTERAL NUTRITION CONSULT NOTE   Indication: Prolonged ileus, perforated diverticulitis with abscess s/p percutaneous drainage on 12/22,  Patient Measurements: Height: 5\' 8"  (172.7 cm) Weight: 220 lb (99.8 kg) IBW/kg (Calculated) : 68.4 TPN AdjBW (KG): 76.2 Body mass index is 33.45 kg/m. Usual Weight:    Assessment:   Glucose / Insulin: SSI q6h Electrolytes: WNL Renal: Scr 0.74 LFTs / TGs: in am Prealbumin / albumin:  Intake / Output; MIVF:  GI Imaging: Surgeries / Procedures: perforated diverticulitis with abscess s/p percutaneous drainage on 12/22,   Central access:  TPN start date: 12/24  Nutritional Goals (per RD recommendation on 12/24): Goal TPN rate is 83 mL/hr (Regimen @ goal rate provides 1914kcal/day, 100g/day protein, 2277ml volume )  Current Nutrition:  NPO  Plan:  Increasing TPN Clinimix E 5/15 to 19mL/hr at 1800 (goal rate will be 83 ml/hr). MVI, trace and Thiamine added to TPN. MVI on shortage and will be added to TPN on Mondays and Thursdays. Thiamine x 3 days- started 12/24 - last day 12/26.  -Initiate 20% lipids @ 244ml daily x 12 hours -Pt at high refeeding risk per dietician; recommend check P, K, and Mg daily for 3 days after TPN initation. Monitor regular TPN labs on Mon/Thurs.  -Initiate Sensitive q6h SSI and adjust as needed  -Reduce MIVF to 17 mL/hr at 1800 (total IVF + TPN to = 100 ml/hr -may need further surgery per note 12/23 - No electrolyte replacement   Oswald Hillock, PharmD, BCPS 05/26/2019,8:43 AM

## 2019-05-27 LAB — CBC
HCT: 26.4 % — ABNORMAL LOW (ref 39.0–52.0)
Hemoglobin: 9.2 g/dL — ABNORMAL LOW (ref 13.0–17.0)
MCH: 29 pg (ref 26.0–34.0)
MCHC: 34.8 g/dL (ref 30.0–36.0)
MCV: 83.3 fL (ref 80.0–100.0)
Platelets: 458 10*3/uL — ABNORMAL HIGH (ref 150–400)
RBC: 3.17 MIL/uL — ABNORMAL LOW (ref 4.22–5.81)
RDW: 12.4 % (ref 11.5–15.5)
WBC: 13 10*3/uL — ABNORMAL HIGH (ref 4.0–10.5)
nRBC: 0 % (ref 0.0–0.2)

## 2019-05-27 LAB — AEROBIC/ANAEROBIC CULTURE W GRAM STAIN (SURGICAL/DEEP WOUND)

## 2019-05-27 LAB — BASIC METABOLIC PANEL
Anion gap: 8 (ref 5–15)
BUN: 10 mg/dL (ref 6–20)
CO2: 26 mmol/L (ref 22–32)
Calcium: 8.5 mg/dL — ABNORMAL LOW (ref 8.9–10.3)
Chloride: 105 mmol/L (ref 98–111)
Creatinine, Ser: 0.61 mg/dL (ref 0.61–1.24)
GFR calc Af Amer: >60 mL/min (ref >60)
GFR calc non Af Amer: >60 mL/min (ref >60)
Glucose, Bld: 119 mg/dL — ABNORMAL HIGH (ref 70–99)
Potassium: 3.6 mmol/L (ref 3.5–5.1)
Sodium: 139 mmol/L (ref 135–145)

## 2019-05-27 LAB — GLUCOSE, CAPILLARY
Glucose-Capillary: 111 mg/dL — ABNORMAL HIGH (ref 70–99)
Glucose-Capillary: 113 mg/dL — ABNORMAL HIGH (ref 70–99)
Glucose-Capillary: 116 mg/dL — ABNORMAL HIGH (ref 70–99)
Glucose-Capillary: 122 mg/dL — ABNORMAL HIGH (ref 70–99)
Glucose-Capillary: 125 mg/dL — ABNORMAL HIGH (ref 70–99)

## 2019-05-27 MED ORDER — FAT EMULSION PLANT BASED 20 % IV EMUL
250.0000 mL | INTRAVENOUS | Status: AC
  Administered 2019-05-27: 18:00:00 250 mL via INTRAVENOUS
  Filled 2019-05-27: qty 250

## 2019-05-27 MED ORDER — TRACE MINERALS CU-MN-SE-ZN 300-55-60-3000 MCG/ML IV SOLN
INTRAVENOUS | Status: AC
  Filled 2019-05-27: qty 2000

## 2019-05-27 NOTE — Plan of Care (Signed)
No complaints of pain today.  Drain with minimal output.  Diet advanced to full liquid and patient tolerating it.  Encouraged to walk around in the hallway.  No significant changes.

## 2019-05-27 NOTE — Progress Notes (Signed)
PHARMACY - TOTAL PARENTERAL NUTRITION CONSULT NOTE   Indication: Prolonged ileus, perforated diverticulitis with abscess s/p percutaneous drainage on 12/22,  Patient Measurements: Height: 5\' 8"  (172.7 cm) Weight: 220 lb (99.8 kg) IBW/kg (Calculated) : 68.4 TPN AdjBW (KG): 76.2 Body mass index is 33.45 kg/m. Usual Weight:    Assessment:   Glucose / Insulin: SSI q6h Electrolytes: WNL Renal: Scr 0.74 LFTs / TGs: in am Prealbumin / albumin:  Intake / Output; MIVF:  GI Imaging: Surgeries / Procedures: perforated diverticulitis with abscess s/p percutaneous drainage on 12/22,   Central access:  TPN start date: 12/24  Nutritional Goals (per RD recommendation on 12/24): Goal TPN rate is 83 mL/hr (Regimen @ goal rate provides 1914kcal/day, 100g/day protein, 2235ml volume )  Current Nutrition:  Plan advance to full liquid diet 12/26  Plan:  Continue TPN Clinimix E 5/15 to 52mL/hr at 1800 (goal rate). MVI, trace and Thiamine added to TPN. MVI on shortage and will be added to TPN on Mondays and Thursdays. Thiamine x 3 days- started 12/24 - last day 12/26.  -Initiate 20% lipids @ 230ml daily x 12 hours -Pt at high refeeding risk per dietician; recommend check P, K, and Mg daily for 3 days after TPN initation. Monitor regular TPN labs on Mon/Thurs.  -Initiate Sensitive q6h SSI and adjust as needed. SSI/24 hr: 2 units  -Reduce MIVF to 17 mL/hr at 1800 (total IVF + TPN to = 100 ml/hr -may need further surgery per note 12/23 - No electrolyte replacement   Andrey Mccaskill A, PharmD, BCPS 05/27/2019,11:42 AM

## 2019-05-27 NOTE — Progress Notes (Signed)
[place] Hospital Day(s): 9.   Post op day(s):  Marland Kitchen   Interval History: Patient seen and examined, no acute events or new complaints overnight. Patient reports feeling better.  He reported the pain has been improving.  There is no pain radiation.  There is no alleviating or aggravating factor.  Patient has not had any more fevers.  No issues with the drain overnight.  Vital signs in last 24 hours: [min-max] current  Temp:  [98.8 F (37.1 C)] 98.8 F (37.1 C) (12/25 2004) Pulse Rate:  [83-87] 83 (12/25 2004) Resp:  [18-22] 18 (12/25 2004) BP: (129-135)/(80-88) 129/80 (12/25 2004) SpO2:  [98 %-100 %] 98 % (12/25 2004)     Height: 5\' 8"  (172.7 cm) Weight: 99.8 kg BMI (Calculated): 33.46   Drain: 20 mL serosanguineous  Physical Exam:  Constitutional: alert, cooperative and no distress  Respiratory: breathing non-labored at rest  Cardiovascular: regular rate and sinus rhythm  Gastrointestinal: soft, non-tender, and non-distended.  Lower abdomen drain in place  Labs:  CBC Latest Ref Rng & Units 05/27/2019 05/26/2019 05/25/2019  WBC 4.0 - 10.5 K/uL 13.0(H) 18.9(H) 15.1(H)  Hemoglobin 13.0 - 17.0 g/dL 9.2(L) 9.3(L) 10.3(L)  Hematocrit 39.0 - 52.0 % 26.4(L) 27.8(L) 29.1(L)  Platelets 150 - 400 K/uL 458(H) 390 353   CMP Latest Ref Rng & Units 05/27/2019 05/26/2019 05/25/2019  Glucose 70 - 99 mg/dL 119(H) 121(H) 106(H)  BUN 6 - 20 mg/dL 10 14 14   Creatinine 0.61 - 1.24 mg/dL 0.61 0.58(L) 0.74  Sodium 135 - 145 mmol/L 139 134(L) 137  Potassium 3.5 - 5.1 mmol/L 3.6 3.5 3.5  Chloride 98 - 111 mmol/L 105 102 102  CO2 22 - 32 mmol/L 26 23 26   Calcium 8.9 - 10.3 mg/dL 8.5(L) 8.2(L) 8.2(L)  Total Protein 6.5 - 8.1 g/dL - 6.4(L) -  Total Bilirubin 0.3 - 1.2 mg/dL - 0.8 -  Alkaline Phos 38 - 126 U/L - 62 -  AST 15 - 41 U/L - 38 -  ALT 0 - 44 U/L - 39 -    Imaging studies: No new pertinent imaging studies   Assessment/Plan:  33 y.o. male with perforated diverticulitis  with abscess.  Today the patient is doing much better.  The pain has been improving.  The white blood cell start to decrease.  There has been no fever.  Patient tolerated clear liquid.  I will advance diet to full liquid diet.  I will continue with DVT prophylaxis.  Patient will need to continue with the drain in place.  We will continue with IV antibiotic therapy.  I encouraged the patient to ambulate.  Arnold Long, MD

## 2019-05-28 LAB — CBC
HCT: 27.6 % — ABNORMAL LOW (ref 39.0–52.0)
Hemoglobin: 9.3 g/dL — ABNORMAL LOW (ref 13.0–17.0)
MCH: 29.5 pg (ref 26.0–34.0)
MCHC: 33.7 g/dL (ref 30.0–36.0)
MCV: 87.6 fL (ref 80.0–100.0)
Platelets: 540 10*3/uL — ABNORMAL HIGH (ref 150–400)
RBC: 3.15 MIL/uL — ABNORMAL LOW (ref 4.22–5.81)
RDW: 12.4 % (ref 11.5–15.5)
WBC: 11.4 10*3/uL — ABNORMAL HIGH (ref 4.0–10.5)
nRBC: 0 % (ref 0.0–0.2)

## 2019-05-28 LAB — COMPREHENSIVE METABOLIC PANEL
ALT: 86 U/L — ABNORMAL HIGH (ref 0–44)
AST: 56 U/L — ABNORMAL HIGH (ref 15–41)
Albumin: 2.9 g/dL — ABNORMAL LOW (ref 3.5–5.0)
Alkaline Phosphatase: 72 U/L (ref 38–126)
Anion gap: 8 (ref 5–15)
BUN: 12 mg/dL (ref 6–20)
CO2: 23 mmol/L (ref 22–32)
Calcium: 8.5 mg/dL — ABNORMAL LOW (ref 8.9–10.3)
Chloride: 106 mmol/L (ref 98–111)
Creatinine, Ser: 0.6 mg/dL — ABNORMAL LOW (ref 0.61–1.24)
GFR calc Af Amer: >60 mL/min (ref >60)
GFR calc non Af Amer: >60 mL/min (ref >60)
Glucose, Bld: 122 mg/dL — ABNORMAL HIGH (ref 70–99)
Potassium: 3.9 mmol/L (ref 3.5–5.1)
Sodium: 137 mmol/L (ref 135–145)
Total Bilirubin: 0.6 mg/dL (ref 0.3–1.2)
Total Protein: 7.1 g/dL (ref 6.5–8.1)

## 2019-05-28 LAB — MAGNESIUM: Magnesium: 2.3 mg/dL (ref 1.7–2.4)

## 2019-05-28 LAB — PHOSPHORUS: Phosphorus: 4.4 mg/dL (ref 2.5–4.6)

## 2019-05-28 LAB — GLUCOSE, CAPILLARY
Glucose-Capillary: 115 mg/dL — ABNORMAL HIGH (ref 70–99)
Glucose-Capillary: 124 mg/dL — ABNORMAL HIGH (ref 70–99)
Glucose-Capillary: 106 mg/dL — ABNORMAL HIGH (ref 70–99)
Glucose-Capillary: 128 mg/dL — ABNORMAL HIGH (ref 70–99)
Glucose-Capillary: 109 mg/dL — ABNORMAL HIGH (ref 70–99)

## 2019-05-28 MED ORDER — FAT EMULSION PLANT BASED 20 % IV EMUL
250.0000 mL | INTRAVENOUS | Status: DC
  Administered 2019-05-28: 250 mL via INTRAVENOUS
  Filled 2019-05-28: qty 250

## 2019-05-28 MED ORDER — TRACE MINERALS CU-MN-SE-ZN 300-55-60-3000 MCG/ML IV SOLN
INTRAVENOUS | Status: DC
  Filled 2019-05-28: qty 2000

## 2019-05-28 NOTE — Progress Notes (Signed)
[place] Hospital Day(s): 10.   Post op day(s):  Marland Kitchen   Interval History: Patient seen and examined, no acute events or new complaints overnight. Tolerated full liquid diet without issues.  Pain continues to improve. No fevers and WBC continues to decrease.  He is eager to get home to his family.   Vital signs in last 24 hours: [min-max] current  Temp:  [98.5 F (36.9 C)-99 F (37.2 C)] 98.6 F (37 C) (12/27 0512) Pulse Rate:  [78-81] 78 (12/27 0512) Resp:  [16-20] 16 (12/27 0512) BP: (132-134)/(86-90) 133/87 (12/27 0512) SpO2:  [98 %-100 %] 98 % (12/27 0512) Weight:  [110.6 kg] 110.6 kg (12/27 0500)     Height: 5\' 8"  (172.7 cm) Weight: 110.6 kg BMI (Calculated): 37.08   Drain: 20 cc, appears less purulent/feculent than prior.  Physical Exam:  Constitutional: alert, cooperative and no distress  Respiratory: breathing non-labored at rest  Cardiovascular: regular rate and sinus rhythm  Gastrointestinal: soft, non-tender, and non-distended.  Lower abdomen drain in place  Labs:  CBC Latest Ref Rng & Units 05/28/2019 05/27/2019 05/26/2019  WBC 4.0 - 10.5 K/uL 11.4(H) 13.0(H) 18.9(H)  Hemoglobin 13.0 - 17.0 g/dL 9.3(L) 9.2(L) 9.3(L)  Hematocrit 39.0 - 52.0 % 27.6(L) 26.4(L) 27.8(L)  Platelets 150 - 400 K/uL 540(H) 458(H) 390   CMP Latest Ref Rng & Units 05/28/2019 05/27/2019 05/26/2019  Glucose 70 - 99 mg/dL 122(H) 119(H) 121(H)  BUN 6 - 20 mg/dL 12 10 14   Creatinine 0.61 - 1.24 mg/dL 0.60(L) 0.61 0.58(L)  Sodium 135 - 145 mmol/L 137 139 134(L)  Potassium 3.5 - 5.1 mmol/L 3.9 3.6 3.5  Chloride 98 - 111 mmol/L 106 105 102  CO2 22 - 32 mmol/L 23 26 23   Calcium 8.9 - 10.3 mg/dL 8.5(L) 8.5(L) 8.2(L)  Total Protein 6.5 - 8.1 g/dL 7.1 - 6.4(L)  Total Bilirubin 0.3 - 1.2 mg/dL 0.6 - 0.8  Alkaline Phos 38 - 126 U/L 72 - 62  AST 15 - 41 U/L 56(H) - 38  ALT 0 - 44 U/L 86(H) - 39    Imaging studies: No new pertinent imaging studies   Assessment/Plan:  33 y.o.  male with perforated diverticulitis with abscess.  Today the patient is doing much better.  The pain has been improving.  The white blood cells continue to decrease.  There has been no fever. Will advance diet to soft. Anticipate weaning TPN tomorrow.  Continue IV abx.  Possible d/c home tomorrow or Tuesday, assuming current trend continues.

## 2019-05-28 NOTE — Progress Notes (Signed)
PHARMACY - TOTAL PARENTERAL NUTRITION CONSULT NOTE   Indication: Prolonged ileus, perforated diverticulitis with abscess s/p percutaneous drainage on 12/22,  Patient Measurements: Height: 5\' 8"  (172.7 cm) Weight: 243 lb 13.3 oz (110.6 kg) IBW/kg (Calculated) : 68.4 TPN AdjBW (KG): 76.2 Body mass index is 37.07 kg/m. Usual Weight:    Assessment:   Glucose / Insulin: SSI q6h Electrolytes: WNL Renal: Scr 0.74 LFTs / TGs: in am Prealbumin / albumin:  Intake / Output; MIVF:  GI Imaging: Surgeries / Procedures: perforated diverticulitis with abscess s/p percutaneous drainage on 12/22,   Central access:  TPN start date: 12/24  Nutritional Goals (per RD recommendation on 12/24): Goal TPN rate is 83 mL/hr (Regimen @ goal rate provides 1914kcal/day, 100g/day protein, 2279ml volume )  Current Nutrition:  Plan advance to soft diet 12/27  Plan:  Continue TPN Clinimix E 5/15 to 74mL/hr at 1800 (goal rate). MVI, trace and Thiamine added to TPN. MVI on shortage and will be added to TPN on Mondays and Thursdays. Thiamine x 3 days-completed ( 12/24-12/26 ) -Initiate 20% lipids @ 280ml daily x 12 hours -Pt at high refeeding risk per dietician; recommend check P, K, and Mg daily for 3 days after TPN initation. Monitor regular TPN labs on Mon/Thurs.  -Initiate Sensitive q6h SSI and adjust as needed. SSI/24 hr: 0 units  -Reduce MIVF to 17 mL/hr at 1800 (total IVF + TPN to = 100 ml/hr -anticipate weaning TPN tomorrow 12/28 per MD note - No electrolyte replacement at this time   Jancie Kercher A, PharmD, BCPS 05/28/2019,1:54 PM

## 2019-05-29 LAB — DIFFERENTIAL
Abs Immature Granulocytes: 0.26 10*3/uL — ABNORMAL HIGH (ref 0.00–0.07)
Basophils Absolute: 0.1 10*3/uL (ref 0.0–0.1)
Basophils Relative: 1 %
Eosinophils Absolute: 0.2 10*3/uL (ref 0.0–0.5)
Eosinophils Relative: 2 %
Immature Granulocytes: 2 %
Lymphocytes Relative: 27 %
Lymphs Abs: 2.9 10*3/uL (ref 0.7–4.0)
Monocytes Absolute: 0.6 10*3/uL (ref 0.1–1.0)
Monocytes Relative: 6 %
Neutro Abs: 6.7 10*3/uL (ref 1.7–7.7)
Neutrophils Relative %: 62 %

## 2019-05-29 LAB — MAGNESIUM: Magnesium: 2.3 mg/dL (ref 1.7–2.4)

## 2019-05-29 LAB — COMPREHENSIVE METABOLIC PANEL
ALT: 84 U/L — ABNORMAL HIGH (ref 0–44)
AST: 45 U/L — ABNORMAL HIGH (ref 15–41)
Albumin: 3.1 g/dL — ABNORMAL LOW (ref 3.5–5.0)
Alkaline Phosphatase: 67 U/L (ref 38–126)
Anion gap: 7 (ref 5–15)
BUN: 12 mg/dL (ref 6–20)
CO2: 24 mmol/L (ref 22–32)
Calcium: 8.7 mg/dL — ABNORMAL LOW (ref 8.9–10.3)
Chloride: 105 mmol/L (ref 98–111)
Creatinine, Ser: 0.62 mg/dL (ref 0.61–1.24)
GFR calc Af Amer: >60 mL/min (ref >60)
GFR calc non Af Amer: >60 mL/min (ref >60)
Glucose, Bld: 111 mg/dL — ABNORMAL HIGH (ref 70–99)
Potassium: 4.3 mmol/L (ref 3.5–5.1)
Sodium: 136 mmol/L (ref 135–145)
Total Bilirubin: 0.6 mg/dL (ref 0.3–1.2)
Total Protein: 7.2 g/dL (ref 6.5–8.1)

## 2019-05-29 LAB — CBC
HCT: 28.7 % — ABNORMAL LOW (ref 39.0–52.0)
Hemoglobin: 9.5 g/dL — ABNORMAL LOW (ref 13.0–17.0)
MCH: 29.2 pg (ref 26.0–34.0)
MCHC: 33.1 g/dL (ref 30.0–36.0)
MCV: 88.3 fL (ref 80.0–100.0)
Platelets: 622 10*3/uL — ABNORMAL HIGH (ref 150–400)
RBC: 3.25 MIL/uL — ABNORMAL LOW (ref 4.22–5.81)
RDW: 12.3 % (ref 11.5–15.5)
WBC: 10.7 10*3/uL — ABNORMAL HIGH (ref 4.0–10.5)
nRBC: 0 % (ref 0.0–0.2)

## 2019-05-29 LAB — GLUCOSE, CAPILLARY
Glucose-Capillary: 104 mg/dL — ABNORMAL HIGH (ref 70–99)
Glucose-Capillary: 127 mg/dL — ABNORMAL HIGH (ref 70–99)

## 2019-05-29 LAB — TRIGLYCERIDES: Triglycerides: 255 mg/dL — ABNORMAL HIGH (ref <150)

## 2019-05-29 LAB — PREALBUMIN: Prealbumin: 34.5 mg/dL (ref 18–38)

## 2019-05-29 LAB — PHOSPHORUS: Phosphorus: 4.1 mg/dL (ref 2.5–4.6)

## 2019-05-29 MED ORDER — AMOXICILLIN-POT CLAVULANATE 875-125 MG PO TABS: 1.0000 | ORAL_TABLET | Freq: Two times a day (BID) | ORAL | 0 refills | Status: AC

## 2019-05-29 MED ORDER — IBUPROFEN 600 MG PO TABS: 600.0000 mg | ORAL_TABLET | Freq: Three times a day (TID) | ORAL | 0 refills | Status: DC | PRN

## 2019-05-29 MED ORDER — SODIUM CHLORIDE 0.9% FLUSH: 5.0000 mL | Freq: Two times a day (BID) | INTRAVENOUS | 1 refills | Status: DC

## 2019-05-29 MED ORDER — HYDROCODONE-ACETAMINOPHEN 5-325 MG PO TABS: 1.0000 | ORAL_TABLET | ORAL | 0 refills | Status: DC | PRN

## 2019-05-29 NOTE — TOC Transition Note (Signed)
Transition of Care [facility]) - CM/SW Discharge Note   Patient Details  Name: Damarea Epimenio Schetter MRN: [id number] Date of Birth: 05-31-1986  [facility]) CM/SW Contact:  Shelbie Ammons, RN Phone Number: 05/29/2019, 11:29 AM   Clinical Narrative:     RNCM consulted for medication assistance. Patient is on no meds prior to hospitalization. Was able to print off coupons for Augmentin, Norco and NS flushes and give to nurse to provide to patient. No other needs identified at this time, nurse reports patient will discharge this afternoon they are weaning him down off of TPN.           Patient Goals and CMS Choice        Discharge Placement                       Discharge Plan and Services                                     Social Determinants of Health (SDOH) Interventions     Readmission Risk Interventions No flowsheet data found.

## 2019-05-29 NOTE — Discharge Summary (Signed)
Patient ID: Alger Tierra Divelbiss MRN: [id number] DOB/AGE: 1986/04/15 33 y.o.  Admit date: 05/18/2019 Discharge date: 05/29/2019   Discharge Diagnoses:  Active Problems:   Diverticulitis of large intestine with perforation without bleeding   Procedures:  CT guided placement of percutaneous drainage catheter  Hospital Course:  Patient was admitted on 12/17 with acute diverticulitis with small abscess.  This did not respond to antibiotics alone and repeat CT scan showed larger abscess which was drained by IR on 12/22.  He required TPN due to ileus but he had subsequent return of bowel function.  His diet was slowly advanced.  His WBC started decreasing and cultures showed E coli, streptococcus mitis, bacteroides.  He remained on IV Zosyn.  His pain was well controlled, no longer had any fevers, had improving WBC, normal bowel function, and was tolerating a diet.  His TPN was weaned off and he was deemed ready for discharge with drain in place and instructions for catheter flushing and care.  On exam, he was in no acute distress with stable vital signs.  His abdomen was soft, non-distended, with only tenderness at the drain insertion site.    Consults: Pharmacy, Dietician, IR  Disposition: Discharge disposition: 01-Home or Self Care       Discharge Instructions    Call MD for:  difficulty breathing, headache or visual disturbances   Complete by: As directed    Call MD for:  persistant nausea and vomiting   Complete by: As directed    Call MD for:  redness, tenderness, or signs of infection (pain, swelling, redness, odor or green/yellow discharge around incision site)   Complete by: As directed    Call MD for:  severe uncontrolled pain   Complete by: As directed    Call MD for:  temperature >100.4   Complete by: As directed    Diet general   Complete by: As directed    Low fiber diet.   Discharge instructions   Complete by: As directed    1.  Flush catheter twice daily with 5  ml of saline.  Change dressing around catheter daily and as needed to keep area clean and dry. 2.  Continue and complete antibiotic course.   Driving Restrictions   Complete by: As directed    Do not drive while taking narcotics for pain control.   Increase activity slowly   Complete by: As directed      Allergies as of 05/29/2019   No Known Allergies     Medication List    TAKE these medications   amoxicillin-clavulanate 875-125 MG tablet Commonly known as: Augmentin Take 1 tablet by mouth 2 (two) times daily for 7 days.   HYDROcodone-acetaminophen 5-325 MG tablet Commonly known as: NORCO/VICODIN Take 1 tablet by mouth every 4 (four) hours as needed for moderate pain or severe pain.   ibuprofen 600 MG tablet Commonly known as: ADVIL Take 1 tablet (600 mg total) by mouth every 8 (eight) hours as needed.   sodium chloride flush 0.9 % Soln Commonly known as: NS 5 mLs by Intracatheter route every 12 (twelve) hours.      Follow-up Information    Pabon, [place] F, MD Follow up in 1 week(s).   Specialty: General Surgery Why: Follow up in office in 1 week for drain removal. Contact information: [address] [place] [address] [phone]

## 2019-05-29 NOTE — Progress Notes (Signed)
Dale Schwartz  A and O x 4. VSS. Pt tolerating diet well. No complaints of pain or nausea. IV removed intact, prescriptions given. Pt voiced understanding of discharge instructions with no further questions. Pt discharged via wheelchair with NT.     Allergies as of 05/29/2019   No Known Allergies     Medication List    TAKE these medications   amoxicillin-clavulanate 875-125 MG tablet Commonly known as: Augmentin Take 1 tablet by mouth 2 (two) times daily for 7 days. Notes to patient: 05/29/19   HYDROcodone-acetaminophen 5-325 MG tablet Commonly known as: NORCO/VICODIN Take 1 tablet by mouth every 4 (four) hours as needed for moderate pain or severe pain.   ibuprofen 600 MG tablet Commonly known as: ADVIL Take 1 tablet (600 mg total) by mouth every 8 (eight) hours as needed.   sodium chloride flush 0.9 % Soln Commonly known as: NS 5 mLs by Intracatheter route every 12 (twelve) hours. Notes to patient: 05/29/19       Vitals:   05/29/19 0531 05/29/19 1323  BP: 125/79 125/82  Pulse: 79 75  Resp: 18 20  Temp: 98.6 F (37 C) 98.5 F (36.9 C)  SpO2: 100% 100%    Francesco Sor

## 2019-06-05 ENCOUNTER — Other Ambulatory Visit: Payer: Self-pay

## 2019-06-05 ENCOUNTER — Telehealth: Payer: Self-pay | Admitting: Emergency Medicine

## 2019-06-05 ENCOUNTER — Encounter: Payer: Self-pay | Admitting: Surgery

## 2019-06-05 ENCOUNTER — Ambulatory Visit (INDEPENDENT_AMBULATORY_CARE_PROVIDER_SITE_OTHER): Payer: Self-pay | Admitting: Surgery

## 2019-06-05 VITALS — BP 142/88 | HR 101 | Temp 97.9°F | Resp 12 | Ht 69.0 in | Wt 244.4 lb

## 2019-06-05 DIAGNOSIS — K5720 Diverticulitis of large intestine with perforation and abscess without bleeding: Secondary | ICD-10-CM

## 2019-06-05 NOTE — Telephone Encounter (Signed)
CT Abdomen Pelvis With Contrast 06/12/2019 at 2:30pm [facility]  Follow up with Dr. Everlene Farrier on 06/14/2019 at 10:45am.   Pt made aware to pick up Contrast at the [facility] prior to appointment and to Return to clinic to see Dr. Everlene Farrier to discuss results. Pt verbalized understanding.

## 2019-06-05 NOTE — Patient Instructions (Signed)
We will call you with your CT Abdomen/Pelvis With Contrast Scan appointment. We will see you in one week to discuss your results.

## 2019-06-05 NOTE — Progress Notes (Signed)
Outpatient Surgical Follow Up  06/05/2019  Dale Schwartz Dale Schwartz is an 34 y.o. male.   Chief Complaint  Patient presents with  . New Patient (Initial Visit)    f/u drain removal, Diverticulitis of large intestine with perforation and abscess without bleeding,    HPI: Mr. Dale Schwartz is a 34 year old male with a history of complicated diverticulitis and abscess requiring drain placement.  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  He has completed antibiotic therapy now feels much better.  Denies any abdominal pain.  No fevers no chills and no drainage from the drain.  History reviewed. No pertinent past medical history.  History reviewed. No pertinent surgical history.  History reviewed. No pertinent family history.  Social History:  reports that he has never smoked. He has never used smokeless tobacco. He reports that he does not drink alcohol or use drugs.  Allergies: No Known Allergies  Medications reviewed.    ROS Full ROS performed and is otherwise negative other than what is stated in HPI   BP (!) 142/88   Pulse (!) 101   Temp 97.9 F (36.6 C) (Temporal)   Resp 12   Ht 5\' 9"  (1.753 m)   Wt 244 lb 6.4 oz (110.9 kg)   SpO2 97%   BMI 36.09 kg/m   Physical Exam NAD Abd: soft, drain in place, serous output Abd non tender , no peritonitis   Assessment/Plan: 34 year old male history of complicated diverticulitis status post drain placement.  Significant improvement.  Given the initial thought of a potential [place] cutaneous fistula I would like to obtain a repeat CT scan before pulling the drain out.  We will arrange for that this week and I will see him back next week and at that time hopefully we can pull the drain out.  No need for emergent surgical intervention at this time   Greater than 50% of the 15 minutes  visit was spent in counseling/coordination of care   38, MD [facility] General Surgeon

## 2019-06-12 ENCOUNTER — Ambulatory Visit
Admission: RE | Admit: 2019-06-12 | Discharge: 2019-06-12 | Disposition: A | Discharge status: Home / Self Care | Payer: Self-pay | Source: Ambulatory Visit | Attending: Surgery | Admitting: Surgery

## 2019-06-12 ENCOUNTER — Other Ambulatory Visit: Payer: Self-pay

## 2019-06-12 DIAGNOSIS — K5720 Diverticulitis of large intestine with perforation and abscess without bleeding: Secondary | ICD-10-CM | POA: Insufficient documentation

## 2019-06-12 MED ORDER — IOHEXOL 300 MG/ML  SOLN
100.0000 mL | Freq: Once | INTRAMUSCULAR | Status: AC | PRN
  Administered 2019-06-12: 100 mL via INTRAVENOUS

## 2019-06-13 ENCOUNTER — Telehealth: Payer: Self-pay | Admitting: Emergency Medicine

## 2019-06-13 NOTE — Telephone Encounter (Signed)
Patient called back to the office. I gave him results and verbalized understanding. States he is doing good and confirmed appointment with Dr. Everlene Farrier tomorrow 06/13/19 at 10:45am.

## 2019-06-13 NOTE — Telephone Encounter (Signed)
Called patient to make him aware of CT Results. Per Dr. Everlene Farrier "patient CT shows that he is improving".  Unable to leave vm due to vm being full. Will attempt to call at a later time.

## 2019-06-14 ENCOUNTER — Other Ambulatory Visit: Payer: Self-pay

## 2019-06-14 ENCOUNTER — Ambulatory Visit (INDEPENDENT_AMBULATORY_CARE_PROVIDER_SITE_OTHER): Payer: Self-pay | Admitting: Surgery

## 2019-06-14 ENCOUNTER — Encounter: Payer: Self-pay | Admitting: Surgery

## 2019-06-14 VITALS — BP 149/97 | HR 89 | Temp 98.1°F | Resp 14 | Ht 69.0 in | Wt 241.0 lb

## 2019-06-14 DIAGNOSIS — K5720 Diverticulitis of large intestine with perforation and abscess without bleeding: Secondary | ICD-10-CM

## 2019-06-14 NOTE — Patient Instructions (Addendum)
We will see you back in 4 weeks.  You may remove the dressing tomorrow and shower. You may place a Band-Aid over the area until it stops draining.   You may return to work next week on 06/19/19.

## 2019-06-14 NOTE — Progress Notes (Signed)
Mr. [name] is a 34 year old male with complicated diverticulitis status post percutaneous drainage of abscess.  He has been doing well.  There is no output from the drain for the last 4 days.  He did have a CT scan of the abdomen and pelvis that have personally reviewed showing significant improvement and a very small minimal collection.  No free air.  He continues to tolerate diet and have normal bowel movements.  His physical exam showed a male no acute distress.  Awake and alert. Abdomen: Soft nontender drain with scant serous drainage within the tubing.  No peritonitis no infection.  Drain was removed.  A/P doing well.  I will see him back in 1 month he will benefit from elective sigmoid colectomy.  Please note that I spent at least 25 minutes in this encounter with greater than 50% spent in coordination and counseling of his care.  No need for any emergent surgical intervention at this time

## 2019-07-05 ENCOUNTER — Ambulatory Visit: Payer: MEDICAID | Admitting: Surgery

## 2019-07-05 ENCOUNTER — Other Ambulatory Visit: Payer: Self-pay

## 2019-07-05 ENCOUNTER — Encounter: Payer: Self-pay | Admitting: Surgery

## 2019-07-05 ENCOUNTER — Ambulatory Visit (INDEPENDENT_AMBULATORY_CARE_PROVIDER_SITE_OTHER): Payer: Self-pay | Admitting: Surgery

## 2019-07-05 VITALS — BP 131/94 | HR 92 | Temp 97.5°F | Resp 14 | Ht 69.0 in | Wt 236.8 lb

## 2019-07-05 DIAGNOSIS — K5720 Diverticulitis of large intestine with perforation and abscess without bleeding: Secondary | ICD-10-CM

## 2019-07-05 NOTE — Patient Instructions (Signed)
hemos colocado una referencia para que se realice una colonoscopia. la oficina se comunicar con usted para [place]. Referral placed to GI for colonoscopy.  le harn una colonoscopia por gastroenterologa y luego volver a ver al Dr. Everlene Farrier para hablar sobre la [place]. To follow up here with Dr Everlene Farrier after his colonoscopy to discuss surgery.

## 2019-07-05 NOTE — Progress Notes (Signed)
Outpatient Surgical Follow Up  07/05/2019  Dale Schwartz Dale Schwartz is an 34 y.o. male.   Chief Complaint  Patient presents with  . Follow-up    diverticulitis    HPI:  Dale Schwartz is a 34 year old male well-known to me with a prior history of diverticulitis with diverticular abscess requiring drain placement and prolonged hospitalization.  He has completed antibiotic therapy and he feels much better.  No fevers no chills.  He is taking p.o. well.  No nausea no vomiting.  He does report some occasional pain in the left side of her abdomen that is sharp intermittent and mild intensity exacerbated by fatty meals.  No other complaints.  No past medical history on file.  No past surgical history on file.  No family history on file.  Social History:  reports that he has never smoked. He has never used smokeless tobacco. He reports that he does not drink alcohol or use drugs.  Allergies: No Known Allergies  Medications reviewed.    ROS Full ROS performed and is otherwise negative other than what is stated in HPI   BP (!) 131/94   Pulse 92   Temp (!) 97.5 F (36.4 C)   Resp 14   Ht 5\' 9"  (1.753 m)   Wt 236 lb 12.8 oz (107.4 kg)   SpO2 97%   BMI 34.97 kg/m   Physical Exam Vitals and nursing note reviewed. Exam conducted with a chaperone present.  Constitutional:      General: He is not in acute distress.    Appearance: Normal appearance. He is normal weight.  Eyes:     General: No scleral icterus.       Right eye: No discharge.        Left eye: No discharge.  Cardiovascular:     Rate and Rhythm: Normal rate and regular rhythm.  Pulmonary:     Effort: Pulmonary effort is normal. No respiratory distress.     Breath sounds: No stridor. No wheezing or rhonchi.  Abdominal:     General: Abdomen is flat. There is no distension.     Palpations: Abdomen is soft. There is no mass.     Tenderness: There is no abdominal tenderness. There is no guarding or rebound.     Hernia: No  hernia is present.  Musculoskeletal:     Cervical back: Normal range of motion and neck supple. No rigidity or tenderness.  Skin:    General: Skin is warm and dry.     Capillary Refill: Capillary refill takes less than 2 seconds.  Neurological:     Mental Status: He is alert and oriented to person, place, and time.  Psychiatric:        Mood and Affect: Mood normal.        Behavior: Behavior normal.        Thought Content: Thought content normal.        Judgment: Judgment normal.         Assessment/Plan: 34 year old male with history of complicated diverticulitis with abscess requiring drainage.  I had a lengthy discussion with the patient and he definitely will benefit from laparoscopic sigmoid colectomy in elective fashion.  Prior to that we will ask GI to perform a colonoscopy before major colon resection.  We went through the details of the colonoscopy and the reasons why.  We also discussed the benefits of elective sigmoidectomy. Currently he is doing well and there is no need for emergent surgical intervention at this time  Greater than 50% of the 25 minutes  visit was spent in counseling/coordination of care   Sterling Big, MD [facility] General Surgeon

## 2019-07-27 ENCOUNTER — Inpatient Hospital Stay
Admission: EM | Admit: 2019-07-27 | Discharge: 2019-08-01 | DRG: 392 | Disposition: A | Discharge status: Home / Self Care | Payer: Medicaid Other | Attending: Surgery | Admitting: Surgery

## 2019-07-27 ENCOUNTER — Encounter: Payer: Self-pay | Admitting: Emergency Medicine

## 2019-07-27 ENCOUNTER — Other Ambulatory Visit: Payer: Self-pay

## 2019-07-27 ENCOUNTER — Emergency Department: Payer: Medicaid Other

## 2019-07-27 DIAGNOSIS — K5720 Diverticulitis of large intestine with perforation and abscess without bleeding: Secondary | ICD-10-CM | POA: Diagnosis present

## 2019-07-27 DIAGNOSIS — Z20822 Contact with and (suspected) exposure to covid-19: Secondary | ICD-10-CM | POA: Diagnosis present

## 2019-07-27 DIAGNOSIS — K5792 Diverticulitis of intestine, part unspecified, without perforation or abscess without bleeding: Secondary | ICD-10-CM | POA: Diagnosis present

## 2019-07-27 DIAGNOSIS — Z23 Encounter for immunization: Secondary | ICD-10-CM | POA: Diagnosis not present

## 2019-07-27 LAB — COMPREHENSIVE METABOLIC PANEL
ALT: 24 U/L (ref 0–44)
AST: 17 U/L (ref 15–41)
Albumin: 4.2 g/dL (ref 3.5–5.0)
Alkaline Phosphatase: 43 U/L (ref 38–126)
Anion gap: 8 (ref 5–15)
BUN: 10 mg/dL (ref 6–20)
CO2: 28 mmol/L (ref 22–32)
Calcium: 9.1 mg/dL (ref 8.9–10.3)
Chloride: 103 mmol/L (ref 98–111)
Creatinine, Ser: 0.89 mg/dL (ref 0.61–1.24)
GFR calc Af Amer: >60 mL/min (ref >60)
GFR calc non Af Amer: >60 mL/min (ref >60)
Glucose, Bld: 111 mg/dL — ABNORMAL HIGH (ref 70–99)
Potassium: 3.9 mmol/L (ref 3.5–5.1)
Sodium: 139 mmol/L (ref 135–145)
Total Bilirubin: 1 mg/dL (ref 0.3–1.2)
Total Protein: 7.4 g/dL (ref 6.5–8.1)

## 2019-07-27 LAB — URINALYSIS, COMPLETE (UACMP) WITH MICROSCOPIC
Bacteria, UA: NONE SEEN
Bilirubin Urine: NEGATIVE
Glucose, UA: NEGATIVE mg/dL
Hgb urine dipstick: NEGATIVE
Ketones, ur: NEGATIVE mg/dL
Leukocytes,Ua: NEGATIVE
Nitrite: NEGATIVE
Protein, ur: NEGATIVE mg/dL
Specific Gravity, Urine: 1.023 (ref 1.005–1.030)
WBC, UA: NONE SEEN WBC/hpf (ref 0–5)
pH: 7 (ref 5.0–8.0)

## 2019-07-27 LAB — CBC WITH DIFFERENTIAL/PLATELET
Abs Immature Granulocytes: 0.04 10*3/uL (ref 0.00–0.07)
Basophils Absolute: 0 10*3/uL (ref 0.0–0.1)
Basophils Relative: 0 %
Eosinophils Absolute: 0 10*3/uL (ref 0.0–0.5)
Eosinophils Relative: 0 %
HCT: 39.5 % (ref 39.0–52.0)
Hemoglobin: 13 g/dL (ref 13.0–17.0)
Immature Granulocytes: 0 %
Lymphocytes Relative: 16 %
Lymphs Abs: 1.6 10*3/uL (ref 0.7–4.0)
MCH: 28.3 pg (ref 26.0–34.0)
MCHC: 32.9 g/dL (ref 30.0–36.0)
MCV: 86.1 fL (ref 80.0–100.0)
Monocytes Absolute: 0.7 10*3/uL (ref 0.1–1.0)
Monocytes Relative: 6 %
Neutro Abs: 8.2 10*3/uL — ABNORMAL HIGH (ref 1.7–7.7)
Neutrophils Relative %: 78 %
Platelets: 272 10*3/uL (ref 150–400)
RBC: 4.59 MIL/uL (ref 4.22–5.81)
RDW: 13.4 % (ref 11.5–15.5)
WBC: 10.6 10*3/uL — ABNORMAL HIGH (ref 4.0–10.5)
nRBC: 0 % (ref 0.0–0.2)

## 2019-07-27 LAB — RESPIRATORY PANEL BY RT PCR (FLU A&B, COVID)
Influenza A by PCR: NEGATIVE
Influenza B by PCR: NEGATIVE
SARS Coronavirus 2 by RT PCR: NEGATIVE

## 2019-07-27 MED ORDER — PIPERACILLIN-TAZOBACTAM 3.375 G IVPB 30 MIN
3.3750 g | Freq: Once | INTRAVENOUS | Status: AC
  Administered 2019-07-27: 3.375 g via INTRAVENOUS
  Filled 2019-07-27: qty 50

## 2019-07-27 MED ORDER — MORPHINE SULFATE (PF) 4 MG/ML IV SOLN
4.0000 mg | Freq: Once | INTRAVENOUS | Status: AC
  Administered 2019-07-27: 4 mg via INTRAVENOUS
  Filled 2019-07-27: qty 1

## 2019-07-27 MED ORDER — ENOXAPARIN SODIUM 40 MG/0.4ML ~~LOC~~ SOLN
40.0000 mg | SUBCUTANEOUS | Status: DC
  Administered 2019-07-28 – 2019-07-29 (×2): 40 mg via SUBCUTANEOUS
  Filled 2019-07-27 (×3): qty 0.4

## 2019-07-27 MED ORDER — PNEUMOCOCCAL VAC POLYVALENT 25 MCG/0.5ML IJ INJ
0.5000 mL | INJECTION | INTRAMUSCULAR | Status: AC
  Administered 2019-07-28: 14:00:00 0.5 mL via INTRAMUSCULAR
  Filled 2019-07-27: qty 0.5

## 2019-07-27 MED ORDER — KETOROLAC TROMETHAMINE 30 MG/ML IJ SOLN: 30.0000 mg | Freq: Four times a day (QID) | INTRAMUSCULAR | Status: DC | PRN

## 2019-07-27 MED ORDER — KETOROLAC TROMETHAMINE 30 MG/ML IJ SOLN
30.0000 mg | Freq: Four times a day (QID) | INTRAMUSCULAR | Status: AC
  Administered 2019-07-27 – 2019-08-01 (×20): 30 mg via INTRAVENOUS
  Filled 2019-07-27 (×20): qty 1

## 2019-07-27 MED ORDER — IOHEXOL 300 MG/ML  SOLN
125.0000 mL | Freq: Once | INTRAMUSCULAR | Status: AC | PRN
  Administered 2019-07-27: 07:00:00 125 mL via INTRAVENOUS

## 2019-07-27 MED ORDER — MORPHINE SULFATE (PF) 2 MG/ML IV SOLN: 2.0000 mg | INTRAVENOUS | Status: DC | PRN

## 2019-07-27 MED ORDER — ONDANSETRON 4 MG PO TBDP: 4.0000 mg | ORAL_TABLET | Freq: Four times a day (QID) | ORAL | Status: DC | PRN

## 2019-07-27 MED ORDER — SODIUM CHLORIDE 0.9 % IV SOLN: INTRAVENOUS | Status: DC

## 2019-07-27 MED ORDER — ACETAMINOPHEN 500 MG PO TABS
1000.0000 mg | ORAL_TABLET | Freq: Four times a day (QID) | ORAL | Status: DC
  Administered 2019-07-27 – 2019-08-01 (×19): 1000 mg via ORAL
  Filled 2019-07-27 (×19): qty 2

## 2019-07-27 MED ORDER — ONDANSETRON HCL 4 MG/2ML IJ SOLN
4.0000 mg | Freq: Once | INTRAMUSCULAR | Status: AC
  Administered 2019-07-27: 4 mg via INTRAVENOUS
  Filled 2019-07-27: qty 2

## 2019-07-27 MED ORDER — ONDANSETRON HCL 4 MG/2ML IJ SOLN: 4.0000 mg | Freq: Four times a day (QID) | INTRAMUSCULAR | Status: DC | PRN

## 2019-07-27 MED ORDER — PIPERACILLIN-TAZOBACTAM 3.375 G IVPB
3.3750 g | Freq: Three times a day (TID) | INTRAVENOUS | Status: DC
  Administered 2019-07-27 – 2019-07-29 (×6): 3.375 g via INTRAVENOUS
  Filled 2019-07-27 (×6): qty 50

## 2019-07-27 MED ORDER — METOPROLOL TARTRATE 5 MG/5ML IV SOLN: 5.0000 mg | Freq: Four times a day (QID) | INTRAVENOUS | Status: DC | PRN

## 2019-07-27 NOTE — ED Provider Notes (Signed)
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  I spoke to the OR nurse, Dr. Aleen Campi is currently in the operating room but will be down to see the patient after.  We will send a Covid test.  We will start the patient on IV Zosyn while awaiting surgical consultation.  Surgery saw the patient will be admitting to their service for further treatment.   Minna Antis, MD 07/27/19 1420

## 2019-07-27 NOTE — ED Notes (Signed)
Pt in with co right sided abd pain since 1900 yest. Denies any n.v.d or dysuria. States no fever at this time, hx of the same. States has hx of diverticulitis.

## 2019-07-27 NOTE — ED Notes (Addendum)
Pt returned form CT  Cristy Friedlander, Student-RN

## 2019-07-27 NOTE — ED Triage Notes (Signed)
Patient ambulatory to triage with steady gait, without difficulty or distress noted, mask in place; pt reports rt sided abd pain, nonradiating since yesterday with no  accomp symptoms

## 2019-07-27 NOTE — H&P (Signed)
[facility] SURGICAL HISTORY & PHYSICAL (cpt [phone])  HISTORY OF PRESENT ILLNESS (HPI):  34 y.o. male known to our service following admission in 05/2019 for perforated diverticulitis with abscess managed conservatively. He has been doing well at home until yesterday. He reports the acute onset of umbilical and RLQ abdominal pain suddenly yesterday afternoon. He described the pain as a sharp pain which is a 7/10. The pain has been constant since the onset. No associated fever, chills, cough, congestion, SOB, CP, nausea, emesis, or bowel changes. He has a history of similar pain in December of last year in which he was diagnosed with diverticulitis as described above. Work up in the ED this morning was concerning for leukocytosis to 10.6K and extensive inflammation in the RLQ with possible sinus tract from previous drain but no gross abscess concerning for recurrent vs worsening persistent diverticulitis.   General surgery is consulted by Dr Minna Antis, MD for evaluation and management of recurrent diverticulitis.    PAST MEDICAL HISTORY (PMH):  History reviewed. No pertinent past medical history.  Reviewed. Otherwise negative.   PAST SURGICAL HISTORY (PSH):  History reviewed. No pertinent surgical history.  Reviewed. Otherwise negative.   MEDICATIONS:  Prior to Admission medications   Medication Sig Start Date End Date Taking? Authorizing Provider  ibuprofen (ADVIL) 600 MG tablet Take 1 tablet (600 mg total) by mouth every 8 (eight) hours as needed. 05/29/19   Henrene Dodge, MD     ALLERGIES:  No Known Allergies   SOCIAL HISTORY:  Social History   Socioeconomic History  . Marital status: Married    Spouse name: Not on file  . Number of children: Not on file  . Years of education: Not on file  . Highest education level: Not on file  Occupational History  . Not on file  Tobacco Use  . Smoking status: Never Smoker  . Smokeless tobacco: Never Used  Substance  and Sexual Activity  . Alcohol use: No  . Drug use: No  . Sexual activity: Not on file  Other Topics Concern  . Not on file  Social History Narrative  . Not on file   Social Determinants of Health   Financial Resource Strain:   . Difficulty of Paying Living Expenses: Not on file  Food Insecurity:   . Worried About Programme researcher, broadcasting/film/video in the Last Year: Not on file  . Ran Out of Food in the Last Year: Not on file  Transportation Needs:   . Lack of Transportation (Medical): Not on file  . Lack of Transportation (Non-Medical): Not on file  Physical Activity:   . Days of Exercise per Week: Not on file  . Minutes of Exercise per Session: Not on file  Stress:   . Feeling of Stress : Not on file  Social Connections:   . Frequency of Communication with Friends and Family: Not on file  . Frequency of Social Gatherings with Friends and Family: Not on file  . Attends Religious Services: Not on file  . Active Member of Clubs or Organizations: Not on file  . Attends Banker Meetings: Not on file  . Marital Status: Not on file  Intimate Partner Violence:   . Fear of Current or Ex-Partner: Not on file  . Emotionally Abused: Not on file  . Physically Abused: Not on file  . Sexually Abused: Not on file     FAMILY HISTORY:  No family history on file.  Otherwise negative.   REVIEW  OF SYSTEMS:  Review of Systems  Constitutional: Negative for chills and fever.  HENT: Negative for congestion and sore throat.   Respiratory: Negative for cough and shortness of breath.   Cardiovascular: Negative for chest pain and palpitations.  Gastrointestinal: Positive for abdominal pain. Negative for constipation, diarrhea, nausea and vomiting.  Genitourinary: Negative for dysuria and urgency.  All other systems reviewed and are negative.   VITAL SIGNS:  Temp:  [99.8 F (37.7 C)] 99.8 F (37.7 C) (02/25 0600) Pulse Rate:  [104-118] 104 (02/25 0800) Resp:  [18-22] 18 (02/25 0800) BP:  (138)/(96) 138/96 (02/25 0800) SpO2:  [95 %-97 %] 95 % (02/25 0800) Weight:  [112.2 kg] 112.2 kg (02/25 0559)     Height: 5\' 9"  (175.3 cm) Weight: 112.2 kg BMI (Calculated): 36.5   PHYSICAL EXAM:  Physical Exam Vitals and nursing note reviewed.  Constitutional:      General: He is not in acute distress.    Appearance: He is well-developed. He is obese. He is diaphoretic (Appears slightly diaphoretic). He is not ill-appearing.  HENT:     Head: Normocephalic and atraumatic.  Cardiovascular:     Rate and Rhythm: Regular rhythm. Tachycardia present.     Heart sounds: Normal heart sounds. No murmur. No friction rub. No gallop.   Pulmonary:     Effort: Pulmonary effort is normal. No respiratory distress.     Breath sounds: Normal breath sounds.  Abdominal:     General: Abdomen is flat. There is no distension.     Palpations: Abdomen is soft.     Tenderness: There is abdominal tenderness in the right lower quadrant, periumbilical area and suprapubic area. There is no guarding or rebound.     Comments: He has tenderness primarily in the central and right abdomen, non-distended, no rebound.guarding, not overtly peritonitic, previous drain site in the suprapubic region is well healed  Genitourinary:    Comments: deferred Skin:    General: Skin is warm.     Coloration: Skin is not jaundiced or pale.  Neurological:     General: No focal deficit present.     Mental Status: He is alert and oriented to person, place, and time.  Psychiatric:        Mood and Affect: Mood normal.        Behavior: Behavior normal.     INTAKE/OUTPUT:  This shift: No intake/output data recorded.  Last 2 shifts: @IOLAST2SHIFTS @  Labs:  CBC Latest Ref Rng & Units 07/27/2019 05/29/2019 05/28/2019  WBC 4.0 - 10.5 K/uL 10.6(H) 10.7(H) 11.4(H)  Hemoglobin 13.0 - 17.0 g/dL 05/31/2019 05/30/2019) 09.3)  Hematocrit 39.0 - 52.0 % 39.5 28.7(L) 27.6(L)  Platelets 150 - 400 K/uL 272 622(H) 540(H)   CMP Latest Ref Rng & Units  07/27/2019 05/29/2019 05/28/2019  Glucose 70 - 99 mg/dL 05/31/2019) 05/30/2019) 220(U)  BUN 6 - 20 mg/dL 10 12 12   Creatinine 0.61 - 1.24 mg/dL 542(H 062(B )  Sodium 135 - 145 mmol/L 139 136 137  Potassium 3.5 - 5.1 mmol/L 3.9 4.3 3.9  Chloride 98 - 111 mmol/L 103 105 106  CO2 22 - 32 mmol/L 28 24 23   Calcium 8.9 - 10.3 mg/dL 9.1 7.62) 8.31)  Total Protein 6.5 - 8.1 g/dL 7.4 7.2 7.1  Total Bilirubin 0.3 - 1.2 mg/dL 1.0 0.6 0.6  Alkaline Phos 38 - 126 U/L 43 67 72  AST 15 - 41 U/L 17 45(H) 56(H)  ALT 0 - 44 U/L 24 84(H) 86(H)  Imaging studies:   CT Abdomen/Pelvis (02/25/20210 personally reviewed with significant inflammatory response in the RLQ concerning for recurrent vs worsening persistent diverticulitis, and radiologist report reviewed:  IMPRESSION: Recent complicated diverticulitis requiring percutaneous drainage catheter. There has been recrudescence of inflammation in the right lower quadrant which is extensive. A sinus tract is seen extending from the offending sigmoid diverticulum into an irregular gas and fluid collection in the right lower quadrant.   Assessment/Plan: (ICD-10's: K91.20) 34 y.o. male with leukocytosis and abdominal pain concerning for recurrent diverticulitis with significant inflammatory response/phelgmon in the RLQ.   - Admit to general surgery  - NPO + IVF resuscitation  - IV Abx (Zosyn)   - pain control prn; antiemetics prn  - monitor abdominal examination  - trend leukocytosis; morning labs  - no emergent surgical intervention, he understands that is he fails to improve or clinically deteriorates then he may require surgical intervention sooner and likely temporizing colostomy, which he understands  - May benefit from repeat imaging in 72 hours pending clinical condition to ensure no development of an abscess   - medical management of comorbidities  - mobilize as toelrates  - DVT prophylaxis  All of the above findings and recommendations were  discussed with the patient, and all of his questions were answered to his expressed satisfaction.  -- Edison Simon, PA-C [facility] 07/27/2019, 8:15 AM [phone] M-F: 7am - 4pm

## 2019-07-27 NOTE — ED Provider Notes (Signed)
[facility] Emergency Department Provider Note  ____________________________________________  Time seen: Approximately 6:08 AM  I have reviewed the triage vital signs and the nursing notes.   HISTORY  Chief Complaint Abdominal Pain   HPI Dale Schwartz is a 34 y.o. male with a history of complicated diverticulitis with perforation and abscess back in December 2020 who presents for evaluation of abdominal pain.  Patient reports full recovery from the episode in December after having a drain placed and IV antibiotics.  Reports that yesterday the pain started again located in the right lower quadrant/suprapubic region, currently 7 out of 10, sharp, constant and nonradiating.  No fever, no nausea, no vomiting, no diarrhea, no constipation, no dysuria, no chest pain or shortness of breath.  Patient is followed by Dr. Everlene Farrier with plan for partial colectomy in the near future  History reviewed. No pertinent past medical history.  Patient Active Problem List   Diagnosis Date Noted  . Diverticulitis of large intestine with perforation without bleeding 07/27/2019  . Diverticulitis of large intestine with perforation and abscess without bleeding 05/18/2019    History reviewed. No pertinent surgical history.  Prior to Admission medications   Medication Sig Start Date End Date Taking? Authorizing Provider  ibuprofen (ADVIL) 600 MG tablet Take 1 tablet (600 mg total) by mouth every 8 (eight) hours as needed. 05/29/19   Henrene Dodge, MD    Allergies Patient has no known allergies.  No family history on file.  Social History Social History   Tobacco Use  . Smoking status: Never Smoker  . Smokeless tobacco: Never Used  Substance Use Topics  . Alcohol use: No  . Drug use: No    Review of Systems  Constitutional: Negative for fever. Eyes: Negative for visual changes. ENT: Negative for sore throat. Neck: No neck pain  Cardiovascular: Negative for  chest pain. Respiratory: Negative for shortness of breath. Gastrointestinal: + lower abdominal pain. No vomiting or diarrhea. Genitourinary: Negative for dysuria. Musculoskeletal: Negative for back pain. Skin: Negative for rash. Neurological: Negative for headaches, weakness or numbness. Psych: No SI or HI  ____________________________________________   PHYSICAL EXAM:  VITAL SIGNS: ED Triage Vitals  Enc Vitals Group     BP --      Pulse Rate 07/27/19 0600 (!) 118     Resp 07/27/19 0600 (!) 22     Temp 07/27/19 0600 99.8 F (37.7 C)     Temp Source 07/27/19 0600 Oral     SpO2 07/27/19 0600 97 %     Weight 07/27/19 0559 247 lb 4.8 oz (112.2 kg)     Height 07/27/19 0559 5\' 9"  (1.753 m)     Head Circumference --      Peak Flow --      Pain Score 07/27/19 0559 8     Pain Loc --      Pain Edu? --      Excl. in GC? --     Constitutional: Alert and oriented. Well appearing and in no apparent distress. HEENT:      Head: Normocephalic and atraumatic.         Eyes: Conjunctivae are normal. Sclera is non-icteric.       Mouth/Throat: Mucous membranes are moist.       Neck: Supple with no signs of meningismus. Cardiovascular: Tachycardic with regular rhythm . Respiratory: Normal respiratory effort. Lungs are clear to auscultation bilaterally. No wheezes, crackles, or rhonchi.  Gastrointestinal: Soft, diffusely tender to palpation of the  lower quadrants, worse in the right lower quadrant with localized guarding  Musculoskeletal: Nontender with normal range of motion in all extremities. No edema, cyanosis, or erythema of extremities. Neurologic: Normal speech and language. Face is symmetric. Moving all extremities. No gross focal neurologic deficits are appreciated. Skin: Skin is warm, dry and intact. No rash noted. Psychiatric: Mood and affect are normal. Speech and behavior are normal.  ____________________________________________   LABS (all labs ordered are listed, but only  abnormal results are displayed)  Labs Reviewed  CBC WITH DIFFERENTIAL/PLATELET - Abnormal; Notable for the following components:      Result Value   WBC 10.6 (*)    Neutro Abs 8.2 (*)    All other components within normal limits  COMPREHENSIVE METABOLIC PANEL - Abnormal; Notable for the following components:   Glucose, Bld 111 (*)    All other components within normal limits  URINALYSIS, COMPLETE (UACMP) WITH MICROSCOPIC - Abnormal; Notable for the following components:   Color, Urine YELLOW (*)    APPearance CLEAR (*)    All other components within normal limits  RESPIRATORY PANEL BY RT PCR (FLU A&B, COVID)  BASIC METABOLIC PANEL  MAGNESIUM  PHOSPHORUS  CBC   ____________________________________________  EKG  none  ____________________________________________  RADIOLOGY  I have personally reviewed the images performed during this visit and I agree with the Radiologist's read.   Interpretation by Radiologist:  CT ABDOMEN PELVIS W CONTRAST  Result Date: 07/27/2019 CLINICAL DATA:  Right-sided abdominal pain since yesterday. History of diverticulitis requiring drain. EXAM: CT ABDOMEN AND PELVIS WITH CONTRAST TECHNIQUE: Multidetector CT imaging of the abdomen and pelvis was performed using the standard protocol following bolus administration of intravenous contrast. CONTRAST:  152mL OMNIPAQUE IOHEXOL 300 MG/ML  SOLN COMPARISON:  06/12/2019 FINDINGS: Lower chest: Mild atelectasis at the lung bases. Tiny pulmonary nodule in the left lower lobe considered incidental for patient age. Hepatobiliary: Hepatic steatosis.No evidence of biliary obstruction or stone. Pancreas: Unremarkable. Spleen: Unremarkable. Adrenals/Urinary Tract: Negative adrenals. No hydronephrosis or stone. Possible mild secondary bladder thickening at the apex. Stomach/Bowel: Sigmoid diverticulitis complicated by abscess and percutaneous drain. A soft tissue tract is still seen from the level of the sigmoid colon into  an inflammatory appearance in the right lower quadrant where there are multiple pockets of gas and fluid, the largest discrete pocket measuring nearly 3 cm. A tract is extending extending from this pocket to the midline ventrally where there was a percutaneous drainage catheter previously. Secondary appearing inflammation of right lower quadrant bowel loops. The appendix is partially seen and non dilated or fluid-filled where visible. No bowel obstruction or ileus. Vascular/Lymphatic: No acute vascular abnormality. Reproductive:Negative Other: No ascites or pneumoperitoneum. Musculoskeletal: No acute abnormalities. IMPRESSION: Recent complicated diverticulitis requiring percutaneous drainage catheter. There has been recrudescence of inflammation in the right lower quadrant which is extensive. A sinus tract is seen extending from the offending sigmoid diverticulum into an irregular gas and fluid collection in the right lower quadrant. Electronically Signed   By: Monte Fantasia M.D.   On: 07/27/2019 07:44      ____________________________________________   PROCEDURES  Procedure(s) performed: None Procedures Critical Care performed:  None ____________________________________________   INITIAL IMPRESSION / ASSESSMENT AND PLAN / ED COURSE  34 y.o. male with a history of complicated diverticulitis with perforation and abscess back in December 2020 who presents for evaluation of abdominal pain.  Differential diagnosis includes diverticulitis versus appendicitis versus UTI versus colitis versus SBO versus recurrent abscess.  Plan for CBC,  CMP, urinalysis, CT abdomen pelvis.  Will treat symptoms with morphine and Zofran.    ED COURSE: Labs and CT pending. Care transferred to Dr. Lenard Lance at [facility]    As part of my medical decision making, I reviewed the following data within the electronic MEDICAL RECORD NUMBER Nursing notes reviewed and incorporated, Labs reviewed , Old chart reviewed, Radiograph  reviewed , Notes from prior ED visits and [place] Controlled Substance Database   Please note:  Patient was evaluated in Emergency Department today for the symptoms described in the history of present illness. Patient was evaluated in the context of the global COVID-19 pandemic, which necessitated consideration that the patient might be at risk for infection with the SARS-CoV-2 virus that causes COVID-19. Institutional protocols and algorithms that pertain to the evaluation of patients at risk for COVID-19 are in a state of rapid change based on information released by regulatory bodies including the CDC and federal and state organizations. These policies and algorithms were followed during the patient's care in the ED.  Some ED evaluations and interventions may be delayed as a result of limited staffing during the pandemic.   ____________________________________________   FINAL CLINICAL IMPRESSION(S) / ED DIAGNOSES   Final diagnoses:  Diverticulitis  Colonic diverticular abscess      NEW MEDICATIONS STARTED DURING THIS VISIT:  ED Discharge Orders    None       Note:  This document was prepared using Dragon voice recognition software and may include unintentional dictation errors.    Don Perking, [place], MD 07/27/19 2329

## 2019-07-27 NOTE — ED Notes (Addendum)
Patient transported to CT Cristy Friedlander, Student-RN

## 2019-07-28 LAB — CBC
HCT: 36.6 % — ABNORMAL LOW (ref 39.0–52.0)
Hemoglobin: 11.8 g/dL — ABNORMAL LOW (ref 13.0–17.0)
MCH: 28 pg (ref 26.0–34.0)
MCHC: 32.2 g/dL (ref 30.0–36.0)
MCV: 86.9 fL (ref 80.0–100.0)
Platelets: 252 10*3/uL (ref 150–400)
RBC: 4.21 MIL/uL — ABNORMAL LOW (ref 4.22–5.81)
RDW: 13.4 % (ref 11.5–15.5)
WBC: 9.2 10*3/uL (ref 4.0–10.5)
nRBC: 0 % (ref 0.0–0.2)

## 2019-07-28 LAB — BASIC METABOLIC PANEL
Anion gap: 9 (ref 5–15)
BUN: 18 mg/dL (ref 6–20)
CO2: 27 mmol/L (ref 22–32)
Calcium: 8.9 mg/dL (ref 8.9–10.3)
Chloride: 103 mmol/L (ref 98–111)
Creatinine, Ser: 1.17 mg/dL (ref 0.61–1.24)
GFR calc Af Amer: >60 mL/min (ref >60)
GFR calc non Af Amer: >60 mL/min (ref >60)
Glucose, Bld: 92 mg/dL (ref 70–99)
Potassium: 3.6 mmol/L (ref 3.5–5.1)
Sodium: 139 mmol/L (ref 135–145)

## 2019-07-28 LAB — PHOSPHORUS: Phosphorus: 4.6 mg/dL (ref 2.5–4.6)

## 2019-07-28 LAB — MAGNESIUM: Magnesium: 2.1 mg/dL (ref 1.7–2.4)

## 2019-07-28 MED ORDER — MORPHINE SULFATE (PF) 2 MG/ML IV SOLN: 2.0000 mg | INTRAVENOUS | Status: DC | PRN

## 2019-07-28 MED ORDER — OXYCODONE HCL 5 MG PO TABS
5.0000 mg | ORAL_TABLET | ORAL | Status: DC | PRN
  Filled 2019-07-28: qty 1

## 2019-07-28 NOTE — Progress Notes (Signed)
[facility] SURGICAL PROGRESS NOTE (cpt [phone])  Hospital Day(s): 1.   Interval History: Patient seen and examined, no acute events or new complaints overnight. Patient reports that his abdominal pain is improved significantly. He only has pain with movement. This is managed with pain medications. Denied fever, chills, nausea, or emesis. Leukocytosis has resolved and WBC is now 9.2. Renal function baseline. No electrolyte derangement. No new complaints.   Review of Systems:  Constitutional: denies fever, chills  HEENT: denies cough or congestion  Respiratory: denies any shortness of breath  Cardiovascular: denies chest pain or palpitations  Gastrointestinal: + abdominal pain (improved), denied N/V, or diarrhea/and bowel function as per interval history Genitourinary: denies burning with urination or urinary frequency   Vital signs in last 24 hours: [min-max] current  Temp:  [98.1 F (36.7 C)-100.1 F (37.8 C)] 98.5 F (36.9 C) (02/26 0621) Pulse Rate:  [91-112] 91 (02/26 0621) Resp:  [18-20] 18 (02/26 0621) BP: (130-140)/(80-99) 138/92 (02/26 0621) SpO2:  [95 %-100 %] 97 % (02/26 0621)     Height: 5\' 9"  (175.3 cm) Weight: 112.2 kg BMI (Calculated): 36.5   Intake/Output last 2 shifts:  02/25 0701 - 02/26 0700 In: 213.7 [I.V.:213.7] Out: -    Physical Exam:  Constitutional: alert, cooperative and no distress  HENT: normocephalic without obvious abnormality  Eyes: PERRL, EOM's grossly intact and symmetric  Respiratory: breathing non-labored at rest  Cardiovascular: regular rate and sinus rhythm  Gastrointestinal: soft, non-tender, and non-distended, no rebound/guarding Musculoskeletal: no edema or wounds, motor and sensation grossly intact, NT    Labs:  CBC Latest Ref Rng & Units 07/28/2019 07/27/2019 05/29/2019  WBC 4.0 - 10.5 K/uL 9.2 10.6(H) 10.7(H)  Hemoglobin 13.0 - 17.0 g/dL 11.8(L) 13.0 9.5(L)  Hematocrit 39.0 - 52.0 % 36.6(L) 39.5 28.7(L)  Platelets  150 - 400 K/uL 252 272 622(H)   CMP Latest Ref Rng & Units 07/28/2019 07/27/2019 05/29/2019  Glucose 70 - 99 mg/dL 92 111(H) 111(H)  BUN 6 - 20 mg/dL 18 10 12   Creatinine 0.61 - 1.24 mg/dL 1.17 0.89 0.62  Sodium 135 - 145 mmol/L 139 139 136  Potassium 3.5 - 5.1 mmol/L 3.6 3.9 4.3  Chloride 98 - 111 mmol/L 103 103 105  CO2 22 - 32 mmol/L 27 28 24   Calcium 8.9 - 10.3 mg/dL 8.9 9.1 8.7(L)  Total Protein 6.5 - 8.1 g/dL - 7.4 7.2  Total Bilirubin 0.3 - 1.2 mg/dL - 1.0 0.6  Alkaline Phos 38 - 126 U/L - 43 67  AST 15 - 41 U/L - 17 45(H)  ALT 0 - 44 U/L - 24 84(H)     Imaging studies: No new pertinent imaging studies   Assessment/Plan: (ICD-10's: K68.20) 34 y.o. male with improvement in abdominal pain and resolution in leukocytosis recurrent diverticulitis with significant inflammatory response/phelgmon in the RLQ   - Advance to CLD   - IV Abx (Zosyn --> Day 2)              - pain control prn; antiemetics prn             - monitor abdominal examination             - trend leukocytosis; resolved             - no emergent surgical intervention, he understands that is he fails to improve or clinically deteriorates then he may require surgical intervention sooner and likely temporizing colostomy, which he understands             -  May benefit from repeat imaging in 48-72 hours pending clinical condition to ensure no development of an abscess              - medical management of comorbidities             - mobilize as toelrates             - DVT prophylaxis  All of the above findings and recommendations were discussed with the patient, and the medical team, and all of patient's questions were answered to his expressed satisfaction.  -- Lynden Oxford, PA-C [facility] 07/28/2019, 7:25 AM [phone] M-F: 7am - 4pm

## 2019-07-29 LAB — BASIC METABOLIC PANEL
Anion gap: 6 (ref 5–15)
BUN: 11 mg/dL (ref 6–20)
CO2: 27 mmol/L (ref 22–32)
Calcium: 8.7 mg/dL — ABNORMAL LOW (ref 8.9–10.3)
Chloride: 107 mmol/L (ref 98–111)
Creatinine, Ser: 0.99 mg/dL (ref 0.61–1.24)
GFR calc Af Amer: >60 mL/min (ref >60)
GFR calc non Af Amer: >60 mL/min (ref >60)
Glucose, Bld: 92 mg/dL (ref 70–99)
Potassium: 3.8 mmol/L (ref 3.5–5.1)
Sodium: 140 mmol/L (ref 135–145)

## 2019-07-29 LAB — CBC
HCT: 33.1 % — ABNORMAL LOW (ref 39.0–52.0)
Hemoglobin: 11.1 g/dL — ABNORMAL LOW (ref 13.0–17.0)
MCH: 28.7 pg (ref 26.0–34.0)
MCHC: 33.5 g/dL (ref 30.0–36.0)
MCV: 85.5 fL (ref 80.0–100.0)
Platelets: 252 10*3/uL (ref 150–400)
RBC: 3.87 MIL/uL — ABNORMAL LOW (ref 4.22–5.81)
RDW: 13.2 % (ref 11.5–15.5)
WBC: 9.6 10*3/uL (ref 4.0–10.5)
nRBC: 0 % (ref 0.0–0.2)

## 2019-07-29 MED ORDER — IOHEXOL 9 MG/ML PO SOLN: 500.0000 mL | ORAL | Status: DC

## 2019-07-29 MED ORDER — PIPERACILLIN-TAZOBACTAM 3.375 G IVPB
3.3750 g | Freq: Three times a day (TID) | INTRAVENOUS | Status: DC
  Administered 2019-07-29 – 2019-08-01 (×9): 3.375 g via INTRAVENOUS
  Filled 2019-07-29 (×9): qty 50

## 2019-07-29 MED ORDER — IOHEXOL 9 MG/ML PO SOLN
500.0000 mL | ORAL | Status: AC
  Administered 2019-07-30 (×2): 500 mL via ORAL

## 2019-07-29 NOTE — Progress Notes (Signed)
CC: Diverticulitis Subjective: Mild left lower quadrant pain continues to improve.  Tolerated clear liquid diet.  No fevers or chills.  No vomiting White Count is normal today. Objective: Vital signs in last 24 hours: Temp:  [98.1 F (36.7 C)-99 F (37.2 C)] 98.1 F (36.7 C) (02/27 1203) Pulse Rate:  [83-93] 93 (02/27 1203) Resp:  [18-20] 20 (02/27 0409) BP: (127-150)/(83-95) 150/95 (02/27 1203) SpO2:  [97 %-100 %] 100 % (02/27 1203) Last BM Date: 07/27/19  Intake/Output from previous day: 02/26 0701 - 02/27 0700 In: 3079.1 [P.O.:1200; I.V.:1729.1; IV Piggyback:150] Out: -  Intake/Output this shift: Total I/O In: 120 [P.O.:120] Out: -   Physical exam: No acute distress, awake alert. Abdomen: Soft mild tenderness palpation in the left lower quadrant.  No peritonitis. Extremities: No edema and well-perfused.  Lab Results: CBC  Recent Labs    07/28/19 0340 07/29/19 0434  WBC 9.2 9.6  HGB 11.8* 11.1*  HCT 36.6* 33.1*  PLT 252 252   BMET Recent Labs    07/28/19 0340 07/29/19 0434  NA 139 140  K 3.6 3.8  CL 103 107  CO2 27 27  GLUCOSE 92 92  BUN 18 11  CREATININE 1.17 0.99  CALCIUM 8.9 8.7*   PT/INR No results for input(s): LABPROT, INR in the last 72 hours. ABG No results for input(s): PHART, HCO3 in the last 72 hours.  Invalid input(s): PCO2, PO2  Studies/Results: No results found.  Anti-infectives: Anti-infectives (From admission, onward)   Start     Dose/Rate Route Frequency Ordered Stop   07/27/19 0900  piperacillin-tazobactam (ZOSYN) IVPB 3.375 g     3.375 g 12.5 mL/hr over 240 Minutes Intravenous Every 8 hours 07/27/19 0855     07/27/19 0815  piperacillin-tazobactam (ZOSYN) IVPB 3.375 g     3.375 g 100 mL/hr over 30 Minutes Intravenous  Once 07/27/19 3419 07/27/19 3790      Assessment/Plan: Complicated recurrent diverticulitis improving slowly.  Given the significant for maternal response and persistent pain I would like to obtain a CT  scan of the abdomen tomorrow.  Depending on findings may need a drain and then will be able to dispo.  Currently no peritonitis and no need for emergent surgical intervention.  Please note that I spent at least for 25 minutes in this encounter with greater than 50% spent in coordination counseling of his care   Sterling Big, MD, [facility]  07/29/2019

## 2019-07-30 ENCOUNTER — Inpatient Hospital Stay: Payer: Medicaid Other

## 2019-07-30 LAB — PROTIME-INR
INR: 1 (ref 0.8–1.2)
Prothrombin Time: 13 seconds (ref 11.4–15.2)

## 2019-07-30 MED ORDER — ENOXAPARIN SODIUM 40 MG/0.4ML ~~LOC~~ SOLN
40.0000 mg | SUBCUTANEOUS | Status: DC
  Filled 2019-07-30: qty 0.4

## 2019-07-30 MED ORDER — SODIUM CHLORIDE 0.9 % IV SOLN
INTRAVENOUS | Status: DC | PRN
  Administered 2019-07-30: 75 mL via INTRAVENOUS
  Administered 2019-07-30: 30 mL via INTRAVENOUS
  Administered 2019-07-31: 50 mL via INTRAVENOUS
  Administered 2019-07-31: 250 mL via INTRAVENOUS
  Administered 2019-07-31: 50 mL via INTRAVENOUS

## 2019-07-30 MED ORDER — IOHEXOL 300 MG/ML  SOLN
125.0000 mL | Freq: Once | INTRAMUSCULAR | Status: AC | PRN
  Administered 2019-07-30: 125 mL via INTRAVENOUS

## 2019-07-30 NOTE — Progress Notes (Signed)
CC: Diverticulitis Subjective: Feels better.  No abdominal pain.  I did perform a CT scan showing evidence of enlarging collection with gas and fluid.  No overt free air  Objective: Vital signs in last 24 hours: Temp:  [98 F (36.7 C)-98.1 F (36.7 C)] 98 F (36.7 C) (02/28 1237) Pulse Rate:  [66-83] 66 (02/28 1237) Resp:  [17-18] 18 (02/28 1237) BP: (138-153)/(95-98) 153/98 (02/28 1237) SpO2:  [98 %-99 %] 98 % (02/28 1237) Last BM Date: 07/27/19  Intake/Output from previous day: 02/27 0701 - 02/28 0700 In: 603.2 [P.O.:360; IV Piggyback:243.2] Out: -  Intake/Output this shift: No intake/output data recorded.  Physical exam: No acute distress, awake alert. Abdomen: Soft mild tenderness to palpation in the left lower quadrant.  No peritonitis. Extremities: No edema and well-perfused.   Lab Results: CBC  Recent Labs    07/28/19 0340 07/29/19 0434  WBC 9.2 9.6  HGB 11.8* 11.1*  HCT 36.6* 33.1*  PLT 252 252   BMET Recent Labs    07/28/19 0340 07/29/19 0434  NA 139 140  K 3.6 3.8  CL 103 107  CO2 27 27  GLUCOSE 92 92  BUN 18 11  CREATININE 1.17 0.99  CALCIUM 8.9 8.7*   PT/INR No results for input(s): LABPROT, INR in the last 72 hours. ABG No results for input(s): PHART, HCO3 in the last 72 hours.  Invalid input(s): PCO2, PO2  Studies/Results: CT ABDOMEN PELVIS W CONTRAST  Result Date: 07/30/2019 CLINICAL DATA:  Acute onset umbilical and right lower quadrant pain. History of diverticular abscess. EXAM: CT ABDOMEN AND PELVIS WITH CONTRAST TECHNIQUE: Multidetector CT imaging of the abdomen and pelvis was performed using the standard protocol following bolus administration of intravenous contrast. CONTRAST:  OMNIPAQUE IOHEXOL 300 MG/ML  SOLN COMPARISON:  07/27/2019 FINDINGS: Lower chest: Subsegmental atelectasis noted right base with tiny bilateral pleural effusions. Hepatobiliary: No suspicious focal abnormality within the liver parenchyma. There is no  evidence for gallstones, gallbladder wall thickening, or pericholecystic fluid. No intrahepatic or extrahepatic biliary dilation. Pancreas: No focal mass lesion. No dilatation of the main duct. No intraparenchymal cyst. No peripancreatic edema. Spleen: No splenomegaly. No focal mass lesion. Adrenals/Urinary Tract: No adrenal nodule or mass. No evidence for hydroureter. Bladder decompressed. Stomach/Bowel: Stomach is unremarkable. No gastric wall thickening. No evidence of outlet obstruction. Duodenum is normally positioned as is the ligament of Treitz. No small bowel wall thickening. No small bowel dilatation. The terminal ileum is normal. Stable appearance of the appendix. Right colon and transverse colon unremarkable. The ill-defined right lower quadrant mesenteric collection of gas and fluid is again noted with increased volume of gas today. Gas dissects anteriorly through the mesentery into the preperitoneal midline fat, as before potentially extending into the rectus sheath in the infraumbilical anterior abdominal wall. Origin of the inflammatory changes in the right lower quadrant previously attributed to a sigmoid colon diverticulum which presumably represents the structure visualized on image 77/2 today. Vascular/Lymphatic: No abdominal aortic aneurysm. No abdominal aortic atherosclerotic calcification. There is no gastrohepatic or hepatoduodenal ligament lymphadenopathy. No retroperitoneal or mesenteric lymphadenopathy. No pelvic sidewall lymphadenopathy. Reproductive: The prostate gland and seminal vesicles are unremarkable. Other: No intraperitoneal free fluid. Musculoskeletal: No worrisome lytic or sclerotic osseous abnormality. IMPRESSION: 1. Interval increase in gas component associated with the patient's known right lower quadrant complex gas and fluid collection dissects anteriorly to the midline lower anterior abdominal wall. Electronically Signed   By: Kennith Center M.D.   On: 07/30/2019 07:28  Anti-infectives: Anti-infectives (From admission, onward)   Start     Dose/Rate Route Frequency Ordered Stop   07/29/19 1700  piperacillin-tazobactam (ZOSYN) IVPB 3.375 g     3.375 g 12.5 mL/hr over 240 Minutes Intravenous Every 8 hours 07/29/19 1616     07/27/19 0900  piperacillin-tazobactam (ZOSYN) IVPB 3.375 g  Status:  Discontinued     3.375 g 12.5 mL/hr over 240 Minutes Intravenous Every 8 hours 07/27/19 0855 07/29/19 1616   07/27/19 0815  piperacillin-tazobactam (ZOSYN) IVPB 3.375 g     3.375 g 100 mL/hr over 30 Minutes Intravenous  Once 07/27/19 0811 07/27/19 [id number]      Assessment/Plan: Complicated recurrent diverticulitis w abscess.    CT scan personally reviewed and discussed with Dr. Pascal Lux from interventional radiology.  This is definitely a collection that merits drainage.  We will arrange for drain placement in the morning.  We will keep the patient n.p.o. after midnight and obtain labs. May resume diet today  Currently no peritonitis and no need for emergent surgical intervention.  Please note that I spent at least for 35 minutes in this encounter with greater than 50% spent in coordination counseling of his care   Caroleen Hamman, MD, [facility]  07/30/2019

## 2019-07-31 ENCOUNTER — Ambulatory Visit: Payer: Self-pay | Admitting: Gastroenterology

## 2019-07-31 ENCOUNTER — Inpatient Hospital Stay: Payer: Medicaid Other

## 2019-07-31 DIAGNOSIS — K5720 Diverticulitis of large intestine with perforation and abscess without bleeding: Principal | ICD-10-CM

## 2019-07-31 DIAGNOSIS — K5730 Diverticulosis of large intestine without perforation or abscess without bleeding: Secondary | ICD-10-CM

## 2019-07-31 LAB — CBC
HCT: 34.1 % — ABNORMAL LOW (ref 39.0–52.0)
Hemoglobin: 11.3 g/dL — ABNORMAL LOW (ref 13.0–17.0)
MCH: 28 pg (ref 26.0–34.0)
MCHC: 33.1 g/dL (ref 30.0–36.0)
MCV: 84.4 fL (ref 80.0–100.0)
Platelets: 336 10*3/uL (ref 150–400)
RBC: 4.04 MIL/uL — ABNORMAL LOW (ref 4.22–5.81)
RDW: 12.9 % (ref 11.5–15.5)
WBC: 6.3 10*3/uL (ref 4.0–10.5)
nRBC: 0 % (ref 0.0–0.2)

## 2019-07-31 LAB — BASIC METABOLIC PANEL
Anion gap: 10 (ref 5–15)
BUN: 7 mg/dL (ref 6–20)
CO2: 25 mmol/L (ref 22–32)
Calcium: 9.1 mg/dL (ref 8.9–10.3)
Chloride: 104 mmol/L (ref 98–111)
Creatinine, Ser: 0.84 mg/dL (ref 0.61–1.24)
GFR calc Af Amer: >60 mL/min (ref >60)
GFR calc non Af Amer: >60 mL/min (ref >60)
Glucose, Bld: 86 mg/dL (ref 70–99)
Potassium: 3.6 mmol/L (ref 3.5–5.1)
Sodium: 139 mmol/L (ref 135–145)

## 2019-07-31 MED ORDER — FENTANYL CITRATE (PF) 100 MCG/2ML IJ SOLN
INTRAMUSCULAR | Status: AC | PRN
  Administered 2019-07-31 (×4): 50 ug via INTRAVENOUS

## 2019-07-31 MED ORDER — SODIUM CHLORIDE 0.9% FLUSH
5.0000 mL | Freq: Three times a day (TID) | INTRAVENOUS | Status: DC
  Administered 2019-07-31 – 2019-08-01 (×3): 5 mL

## 2019-07-31 MED ORDER — ENOXAPARIN SODIUM 40 MG/0.4ML ~~LOC~~ SOLN
40.0000 mg | SUBCUTANEOUS | Status: DC
  Administered 2019-08-01: 40 mg via SUBCUTANEOUS
  Filled 2019-07-31: qty 0.4

## 2019-07-31 MED ORDER — HYDROCODONE-ACETAMINOPHEN 5-325 MG PO TABS
1.0000 | ORAL_TABLET | ORAL | Status: DC | PRN
  Administered 2019-07-31: 21:00:00 1 via ORAL
  Filled 2019-07-31: qty 1

## 2019-07-31 MED ORDER — MIDAZOLAM HCL 2 MG/2ML IJ SOLN
INTRAMUSCULAR | Status: AC | PRN
  Administered 2019-07-31 (×4): 1 mg via INTRAVENOUS

## 2019-07-31 MED ORDER — FENTANYL CITRATE (PF) 100 MCG/2ML IJ SOLN
INTRAMUSCULAR | Status: AC
  Filled 2019-07-31: qty 4

## 2019-07-31 MED ORDER — MIDAZOLAM HCL 2 MG/2ML IJ SOLN
INTRAMUSCULAR | Status: AC
  Filled 2019-07-31: qty 4

## 2019-07-31 NOTE — Consult Note (Signed)
Chief Complaint: Patient was seen in consultation today for  Chief Complaint  Patient presents with   Abdominal Pain   Referring Physician(s): Dr. Quenton Fetter  Supervising Physician: Oley Balm  Patient Status: [facility] - In-pt  History of Present Illness: Dale Schwartz is a 34 y.o. Spanish speaking  male History of diverticulitis. Presented to the ED with RLQ pain found to have a RLQ mixed collection of fluid and gas. Team is requesting an abscess drain placement RLQ.  History reviewed. No pertinent past medical history.  History reviewed. No pertinent surgical history.  Allergies: Patient has no known allergies.  Medications: Prior to Admission medications   Medication Sig Start Date End Date Taking? Authorizing Provider  ibuprofen (ADVIL) 600 MG tablet Take 1 tablet (600 mg total) by mouth every 8 (eight) hours as needed. 05/29/19   Henrene Dodge, MD     No family history on file.  Social History   Socioeconomic History   Marital status: Married    Spouse name: Not on file   Number of children: Not on file   Years of education: Not on file   Highest education level: Not on file  Occupational History   Not on file  Tobacco Use   Smoking status: Never Smoker   Smokeless tobacco: Never Used  Substance and Sexual Activity   Alcohol use: No   Drug use: No   Sexual activity: Not on file  Other Topics Concern   Not on file  Social History Narrative   Not on file   Social Determinants of Health   Financial Resource Strain:    Difficulty of Paying Living Expenses: Not on file  Food Insecurity:    Worried About Running Out of Food in the Last Year: Not on file   Ran Out of Food in the Last Year: Not on file  Transportation Needs:    Lack of Transportation (Medical): Not on file   Lack of Transportation (Non-Medical): Not on file  Physical Activity:    Days of Exercise per Week: Not on file   Minutes of Exercise per Session:  Not on file  Stress:    Feeling of Stress : Not on file  Social Connections:    Frequency of Communication with Friends and Family: Not on file   Frequency of Social Gatherings with Friends and Family: Not on file   Attends Religious Services: Not on file   Active Member of Clubs or Organizations: Not on file   Attends Banker Meetings: Not on file   Marital Status: Not on file    Review of Systems: A 12 point ROS discussed and pertinent positives are indicated in the HPI above.  All other systems are negative.  Review of Systems  Constitutional: Negative for fever.  HENT: Negative for congestion.   Respiratory: Negative for cough and shortness of breath.   Cardiovascular: Negative for chest pain.  Gastrointestinal: Positive for abdominal pain ( RLQ).  Neurological: Negative for headaches.  Psychiatric/Behavioral: Negative for behavioral problems and confusion.    Vital Signs: BP (!) 140/100 (BP Location: Right Arm)    Pulse 65    Temp 98.3 F (36.8 C) (Oral)    Resp 20    Ht 5\' 9"  (1.753 m)    Wt 247 lb 4.8 oz (112.2 kg)    SpO2 97%    BMI 36.52 kg/m   Physical Exam Vitals and nursing note reviewed.  Constitutional:      Appearance: He is  well-developed.  HENT:     Head: Normocephalic.  Pulmonary:     Effort: Pulmonary effort is normal.  Musculoskeletal:        General: Normal range of motion.     Cervical back: Normal range of motion.  Skin:    General: Skin is dry.  Neurological:     Mental Status: He is alert and oriented to person, place, and time.     Imaging: CT ABDOMEN PELVIS W CONTRAST  Result Date: 07/30/2019 CLINICAL DATA:  Acute onset umbilical and right lower quadrant pain. History of diverticular abscess. EXAM: CT ABDOMEN AND PELVIS WITH CONTRAST TECHNIQUE: Multidetector CT imaging of the abdomen and pelvis was performed using the standard protocol following bolus administration of intravenous contrast. CONTRAST:  123mL OMNIPAQUE  IOHEXOL 300 MG/ML  SOLN COMPARISON:  07/27/2019 FINDINGS: Lower chest: Subsegmental atelectasis noted right base with tiny bilateral pleural effusions. Hepatobiliary: No suspicious focal abnormality within the liver parenchyma. There is no evidence for gallstones, gallbladder wall thickening, or pericholecystic fluid. No intrahepatic or extrahepatic biliary dilation. Pancreas: No focal mass lesion. No dilatation of the main duct. No intraparenchymal cyst. No peripancreatic edema. Spleen: No splenomegaly. No focal mass lesion. Adrenals/Urinary Tract: No adrenal nodule or mass. No evidence for hydroureter. Bladder decompressed. Stomach/Bowel: Stomach is unremarkable. No gastric wall thickening. No evidence of outlet obstruction. Duodenum is normally positioned as is the ligament of Treitz. No small bowel wall thickening. No small bowel dilatation. The terminal ileum is normal. Stable appearance of the appendix. Right colon and transverse colon unremarkable. The ill-defined right lower quadrant mesenteric collection of gas and fluid is again noted with increased volume of gas today. Gas dissects anteriorly through the mesentery into the preperitoneal midline fat, as before potentially extending into the rectus sheath in the infraumbilical anterior abdominal wall. Origin of the inflammatory changes in the right lower quadrant previously attributed to a sigmoid colon diverticulum which presumably represents the structure visualized on image 77/2 today. Vascular/Lymphatic: No abdominal aortic aneurysm. No abdominal aortic atherosclerotic calcification. There is no gastrohepatic or hepatoduodenal ligament lymphadenopathy. No retroperitoneal or mesenteric lymphadenopathy. No pelvic sidewall lymphadenopathy. Reproductive: The prostate gland and seminal vesicles are unremarkable. Other: No intraperitoneal free fluid. Musculoskeletal: No worrisome lytic or sclerotic osseous abnormality. IMPRESSION: 1. Interval increase in gas  component associated with the patient's known right lower quadrant complex gas and fluid collection dissects anteriorly to the midline lower anterior abdominal wall. Electronically Signed   By: Misty Stanley M.D.   On: 07/30/2019 07:28   CT ABDOMEN PELVIS W CONTRAST  Result Date: 07/27/2019 CLINICAL DATA:  Right-sided abdominal pain since yesterday. History of diverticulitis requiring drain. EXAM: CT ABDOMEN AND PELVIS WITH CONTRAST TECHNIQUE: Multidetector CT imaging of the abdomen and pelvis was performed using the standard protocol following bolus administration of intravenous contrast. CONTRAST:  164mL OMNIPAQUE IOHEXOL 300 MG/ML  SOLN COMPARISON:  06/12/2019 FINDINGS: Lower chest: Mild atelectasis at the lung bases. Tiny pulmonary nodule in the left lower lobe considered incidental for patient age. Hepatobiliary: Hepatic steatosis.No evidence of biliary obstruction or stone. Pancreas: Unremarkable. Spleen: Unremarkable. Adrenals/Urinary Tract: Negative adrenals. No hydronephrosis or stone. Possible mild secondary bladder thickening at the apex. Stomach/Bowel: Sigmoid diverticulitis complicated by abscess and percutaneous drain. A soft tissue tract is still seen from the level of the sigmoid colon into an inflammatory appearance in the right lower quadrant where there are multiple pockets of gas and fluid, the largest discrete pocket measuring nearly 3 cm. A tract is extending extending  from this pocket to the midline ventrally where there was a percutaneous drainage catheter previously. Secondary appearing inflammation of right lower quadrant bowel loops. The appendix is partially seen and non dilated or fluid-filled where visible. No bowel obstruction or ileus. Vascular/Lymphatic: No acute vascular abnormality. Reproductive:Negative Other: No ascites or pneumoperitoneum. Musculoskeletal: No acute abnormalities. IMPRESSION: Recent complicated diverticulitis requiring percutaneous drainage catheter. There  has been recrudescence of inflammation in the right lower quadrant which is extensive. A sinus tract is seen extending from the offending sigmoid diverticulum into an irregular gas and fluid collection in the right lower quadrant. Electronically Signed   By: Marnee Spring M.D.   On: 07/27/2019 07:44    Labs:  CBC: Recent Labs    07/27/19 0628 07/28/19 0340 07/29/19 0434 07/31/19 0327  WBC 10.6* 9.2 9.6 6.3  HGB 13.0 11.8* 11.1* 11.3*  HCT 39.5 36.6* 33.1* 34.1*  PLT 272 252 252 336    COAGS: Recent Labs    05/23/19 0501 07/30/19 1308  INR 1.0 1.0  APTT 33  --     BMP: Recent Labs    07/27/19 0628 07/28/19 0340 07/29/19 0434 07/31/19 0327  NA 139 139 140 139  K 3.9 3.6 3.8 3.6  CL 103 103 107 104  CO2 28 27 27 25   GLUCOSE 111* 92 92 86  BUN 10 18 11 7   CALCIUM 9.1 8.9 8.7* 9.1  CREATININE 0.89 1.17 0.99 0.84  GFRNONAA >60 >60 >60 >60  GFRAA >60 >60 >60 >60    LIVER FUNCTION TESTS: Recent Labs    05/26/19 0451 05/28/19 0520 05/29/19 0544 07/27/19 0628  BILITOT 0.8 0.6 0.6 1.0  AST 38 56* 45* 17  ALT 39 86* 84* 24  ALKPHOS 62 72 67 43  PROT 6.4* 7.1 7.2 7.4  ALBUMIN 2.7* 2.9* 3.1* 4.2    TUMOR MARKERS: No results for input(s): AFPTM, CEA, CA199, CHROMGRNA in the last 8760 hours.  Assessment and Plan:  34 y.o, Spanish speaking male inpatient. History of diverticulitis. Presented to the ED with RLQ pain found to have a RLQ mixed collection of fluid and gas. Patient previously had a RLQ abscess drain placed on 12.20.20. Drain was removed as OP by surgery on 1.13.21. Team is requesting an abscess drain placement RLQ.  Pertinent Imaging 2.28.21 - CT abd pelvis reads Interval increase in gas component associated with the patient's known right lower quadrant complex gas and fluid collection dissects anteriorly to the midline lower anterior abdominal wall  Pertinent IR History 12.20.20 -Placement abscess drain RLQ - 12 Fr. 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 Patient is afebrile.    Patient tentatively scheduled for procedure for 3.1.21. Team instructed to: Keep Patient to be NPO after midnight Continue to hold prophylactic anticoagulation at midnight Place orders for diagnostics  IR will call patient when ready.  Risks and benefits of RLQ abscess drain placement was discussed with the patient and/or patient's family including, but not limited to bleeding, infection, damage to adjacent structures or low yield requiring additional tests.  All of the questions were answered and there is agreement to proceed.  Using an interpreter the consent signed and in chart.      Thank you for this interesting consult.  I greatly enjoyed meeting Dale Schwartz and look forward to participating in their care.  A copy of this report was  sent to the requesting provider on this date.  Electronically Signed: Marletta Lor, NP 07/31/2019, 8:47 AM   I spent a total of 40 Minutes    in face to face in clinical consultation, greater than 50% of which was counseling/coordinating care for RLQ abscess drain placement

## 2019-07-31 NOTE — Plan of Care (Signed)
  Problem: Education: Goal: Knowledge of General Education information will improve Description Including pain rating scale, medication(s)/side effects and non-pharmacologic comfort measures Outcome: Progressing   Problem: Clinical Measurements: Goal: Will remain free from infection Outcome: Progressing   Problem: Nutrition: Goal: Adequate nutrition will be maintained Outcome: Progressing   Problem: Coping: Goal: Level of anxiety will decrease Outcome: Progressing   Problem: Elimination: Goal: Will not experience complications related to bowel motility Outcome: Progressing   

## 2019-07-31 NOTE — Progress Notes (Signed)
Returned pt to room 203, post procedure recovery in room report given to [place] RN, care to be  turned over to her after 11:45. Pt received 4 mg Versed and 200 mcg Fentanyl during abscess drain placement. Tube to right lower abdomen draining bloody tinged fluid, straight drain to bag. 12 french tube to abscess

## 2019-07-31 NOTE — Procedures (Signed)
  Procedure: CT guided RLQ abscess drain catheter placement   EBL:   minimal Complications:  none immediate  See full dictation in [employer].  Thora Lance MD Main # [phone] Pager  [phone]

## 2019-07-31 NOTE — Progress Notes (Addendum)
[facility] SURGICAL PROGRESS NOTE (cpt: [phone])  Hospital Day(s): 4.   Interval History: Patient seen and examined, no acute events or new complaints overnight. Patient reports he is doing well. Abdominal pain remains improved. No fever, chills, nausea, or emesis. WBC has remained in normal limits. He did have a CT Scan over the weekend concerning for enlarging fluid/gas collection. Plan for drainage with IR today  Review of Systems:  Constitutional: denies fever, chills  Respiratory: denies any shortness of breath  Cardiovascular: denies chest pain or palpitations  Gastrointestinal: denies abdominal pain, N/V, or diarrhea/and bowel function as per interval history Musculoskeletal: denies pain, decreased motor or sensation Integumentary: denies any other rashes or skin discolorations  Vital signs in last 24 hours: [min-max] current  Temp:  [98 F (36.7 C)-98.6 F (37 C)] 98.3 F (36.8 C) (03/01 0420) Pulse Rate:  [65-67] 65 (03/01 0420) Resp:  [16-20] 20 (03/01 0420) BP: (140-153)/(98-103) 140/100 (03/01 0420) SpO2:  [97 %-98 %] 97 % (03/01 0420)     Height: 5\' 9"  (175.3 cm) Weight: 112.2 kg BMI (Calculated): 36.5   Intake/Output last 2 shifts:  02/28 0701 - 03/01 0700 In: 258.8 [I.V.:153.9; IV Piggyback:104.9] Out: 450 [Urine:450]   Physical Exam:  Constitutional: alert, cooperative and no distress  HENT: normocephalic without obvious abnormality  Eyes: PERRL, EOM's grossly intact and symmetric  Respiratory: breathing non-labored at rest  Cardiovascular: regular rate and sinus rhythm  Gastrointestinal: soft, non-tender, and non-distended, no rebound/guarding Musculoskeletal: no edema or wounds, motor and sensation grossly intact, NT    Labs:  CBC Latest Ref Rng & Units 07/31/2019 07/29/2019 07/28/2019  WBC 4.0 - 10.5 K/uL 6.3 9.6 9.2  Hemoglobin 13.0 - 17.0 g/dL 11.3(L) 11.1(L) 11.8(L)  Hematocrit 39.0 - 52.0 % 34.1(L) 33.1(L) 36.6(L)  Platelets 150 - 400  K/uL 336 252 252   CMP Latest Ref Rng & Units 07/31/2019 07/29/2019 07/28/2019  Glucose 70 - 99 mg/dL 86 92 92  BUN 6 - 20 mg/dL 7 11 18   Creatinine 0.61 - 1.24 mg/dL 0.84 0.99 1.17  Sodium 135 - 145 mmol/L 139 140 139  Potassium 3.5 - 5.1 mmol/L 3.6 3.8 3.6  Chloride 98 - 111 mmol/L 104 107 103  CO2 22 - 32 mmol/L 25 27 27   Calcium 8.9 - 10.3 mg/dL 9.1 8.7(L) 8.9  Total Protein 6.5 - 8.1 g/dL - - -  Total Bilirubin 0.3 - 1.2 mg/dL - - -  Alkaline Phos 38 - 126 U/L - - -  AST 15 - 41 U/L - - -  ALT 0 - 44 U/L - - -     Imaging studies: No new pertinent imaging studies   Assessment/Plan: (ICD-10's: K20.20) 34 y.o. 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   -- Edison Simon, PA-C [facility] 07/31/2019, 7:32 AM [phone] M-F: 7am - 4pm  Pt seen and examined. I agree w Above. Drain to be placed today Hopefully may DC tomorrow or Wednesday depending on clinical condition

## 2019-08-01 MED ORDER — AMOXICILLIN-POT CLAVULANATE 875-125 MG PO TABS: 1.0000 | ORAL_TABLET | Freq: Two times a day (BID) | ORAL | 0 refills | Status: DC

## 2019-08-01 MED ORDER — IBUPROFEN 600 MG PO TABS: 600.0000 mg | ORAL_TABLET | Freq: Three times a day (TID) | ORAL | 0 refills | Status: AC | PRN

## 2019-08-01 NOTE — Discharge Summary (Signed)
[facility] SURGICAL DISCHARGE SUMMARY (cpt: [phone])  Patient ID: Dale Schwartz MRN: [id number] DOB/AGE: 1985-09-12 34 y.o.  Admit date: 07/27/2019 Discharge date: 08/01/2019  Discharge Diagnoses Patient Active Problem List   Diagnosis Date Noted  . Diverticulitis of large intestine with perforation without bleeding 07/27/2019  . Diverticulitis of large intestine with perforation and abscess without bleeding 05/18/2019    Consultants IR  Procedures 03/01: CT IMAGE GUIDED DRAINAGE BY PERCUTANEOUS CATHETER - IR (Dr Deanne Coffer)   HPI: 34 y.o. male known to our service following admission in 05/2019 for perforated diverticulitis with abscess managed conservatively. He has been doing well at home until yesterday. He reports the acute onset of umbilical and RLQ abdominal pain suddenly yesterday afternoon. He described the pain as a sharp pain which is a 7/10. The pain has been constant since the onset. No associated fever, chills, cough, congestion, SOB, CP, nausea, emesis, or bowel changes. He has a history of similar pain in December of last year in which he was diagnosed with diverticulitis as described above. Work up in the ED this morning was concerning for leukocytosis to 10.6K and extensive inflammation in the RLQ with possible sinus tract from previous drain but no gross abscess concerning for recurrent vs worsening persistent diverticulitis.   Hospital Course: Patient was admitted to general surgery service and conservative management with NOP, IVF, and IV Abx was initiated. He did well, his leukocytosis resolved, and his abdominal pain improved. He did have a repeat CT scan on 02/28 which was concerning for enlarging gas and fluid collection. He underwent CT guided drain placement on 03/01. Advancement of patient's diet and ambulation were well-tolerated. The remainder of patient's hospital course was essentially unremarkable, and discharge planning was initiated  accordingly with patient safely able to be discharged home with appropriate discharge instructions, antibiotics (Augmentin x10 days), pain control, and outpatient follow-up after all of his questions were answered to his expressed satisfaction.   Discharge Condition: good   Physical Examination:  Constitutional: alert, cooperative and no distress  HENT: normocephalic without obvious abnormality  Eyes: PERRL, EOM's grossly intact and symmetric  Respiratory: breathing non-labored at rest  Cardiovascular: regular rate and sinus rhythm  Gastrointestinal: soft, non-tender, and non-distended, no rebound/guarding, drain in suprapubic region with serosanguinous output Musculoskeletal: no edema or wounds, motor and sensation grossly intact, NT     Allergies as of 08/01/2019   No Known Allergies     Medication List    TAKE these medications   amoxicillin-clavulanate 875-125 MG tablet Commonly known as: Augmentin Take 1 tablet by mouth 2 (two) times daily for 10 days.   ibuprofen 600 MG tablet Commonly known as: ADVIL Take 1 tablet (600 mg total) by mouth every 8 (eight) hours as needed.        Follow-up Information    Pabon, [place] F, MD. Schedule an appointment as soon as possible for a visit on 08/07/2019.   Specialty: General Surgery Why: hospital follow up for diverticulitis with abscess, has drain Contact information: [address] [place] [address] [phone]            Time spent on discharge management including discussion of hospital course, clinical condition, outpatient instructions, prescriptions, and follow up with the patient and members of the medical team: >30 minutes  -- Lynden Oxford , PA-C [facility]  08/01/2019, 8:39 AM [phone] M-F: 7am - 4pm

## 2019-08-01 NOTE — Progress Notes (Signed)
Angelina Canales Bonilla  A and O x 4. VSS. Pt tolerating diet well. No complaints of pain or nausea. IV removed intact, prescriptions given. Pt voiced understanding of discharge instructions with no further questions. Pt discharged home.     Allergies as of 08/01/2019   No Known Allergies     Medication List    TAKE these medications   amoxicillin-clavulanate 875-125 MG tablet Commonly known as: Augmentin Take 1 tablet by mouth 2 (two) times daily for 10 days. Notes to patient: Evening 08/01/19   ibuprofen 600 MG tablet Commonly known as: ADVIL Take 1 tablet (600 mg total) by mouth every 8 (eight) hours as needed. Notes to patient: As needed       Vitals:   07/31/19 2014 08/01/19 0458  BP: (!) 148/104 (!) 133/100  Pulse: 66 65  Resp: 16 16  Temp: 98.6 F (37 C) (!) 97.5 F (36.4 C)  SpO2: 95% 97%    Suzzanne Cloud

## 2019-08-01 NOTE — TOC Transition Note (Signed)
Transition of Care [facility]) - CM/SW Discharge Note   Patient Details  Name: Dale Schwartz MRN: [id number] Date of Birth: [birth year]  [facility] CM/SW Contact:  Candie Chroman, LCSW Phone Number: 08/01/2019, 9:21 AM   Clinical Narrative: CSW met with patient, introduced role, and explained that discharge planning would be discussed. Patient confirmed he does not have a PCP. Provided booklet for free and low-cost health care in [facility] as well as intake paperwork for [facility]. Patient will discharge on Augmentin. Cost at [facility] with [facility] coupon is $26.50. Patient said he can afford it. Patient has orders to discharge home today. He will have a ride home. No further concerns. CSW signing off.    Final next level of care: Home/Self Care Barriers to Discharge: Barriers Resolved   Patient Goals and CMS Choice        Discharge Placement                       Discharge Plan and Services     Post Acute Care Choice: NA                               Social Determinants of Health (SDOH) Interventions     Readmission Risk Interventions No flowsheet data found.

## 2019-08-02 LAB — AEROBIC/ANAEROBIC CULTURE W GRAM STAIN (SURGICAL/DEEP WOUND)

## 2019-08-07 ENCOUNTER — Ambulatory Visit (INDEPENDENT_AMBULATORY_CARE_PROVIDER_SITE_OTHER): Payer: Self-pay | Admitting: Surgery

## 2019-08-07 ENCOUNTER — Other Ambulatory Visit: Payer: Self-pay

## 2019-08-07 ENCOUNTER — Encounter: Payer: Self-pay | Admitting: Surgery

## 2019-08-07 VITALS — BP 149/94 | HR 89 | Temp 97.5°F | Ht 69.0 in | Wt 242.6 lb

## 2019-08-07 DIAGNOSIS — K5720 Diverticulitis of large intestine with perforation and abscess without bleeding: Secondary | ICD-10-CM

## 2019-08-07 NOTE — Progress Notes (Signed)
Outpatient Surgical Follow Up  08/07/2019  Dale Schwartz is an 34 y.o. male.   Chief Complaint  Patient presents with  . Follow-up    diverticulitis    HPI: Urinary is a very well-known 34 year old male with history of recurrent diverticulitis and diverticular abscess status post placement of a drain.  He is doing much better.  No pain.  Able to have a regular diet.  No fevers no chills. He could not have a colonoscopy given that his recurrence happened before his scheduled colonoscopy.  He is very concerned that this may happen again and does not really wish to wait for a formal colonoscopy.  No drainage from JP for the last week or so I personally reviewed his last CT scan showing evidence of a collection likely related to diverticular abscess.    History reviewed. No pertinent past medical history.  History reviewed. No pertinent surgical history.  History reviewed. No pertinent family history.  Social History:  reports that he has never smoked. He has never used smokeless tobacco. He reports that he does not drink alcohol or use drugs.  Allergies: No Known Allergies  Medications reviewed.    ROS Full ROS performed and is otherwise negative other than what is stated in HPI   BP (!) 149/94   Pulse 89   Temp (!) 97.5 F (36.4 C)   Ht 5\' 9"  (1.753 m)   Wt 242 lb 9.6 oz (110 kg)   SpO2 97%   BMI 35.83 kg/m   Physical Exam Vitals and nursing note reviewed.  Constitutional:      General: He is not in acute distress.    Appearance: Normal appearance. He is normal weight.  Cardiovascular:     Rate and Rhythm: Normal rate.  Pulmonary:     Effort: Pulmonary effort is normal. No respiratory distress.     Breath sounds: No stridor.  Abdominal:     General: Abdomen is flat. There is no distension.     Palpations: There is no mass.     Tenderness: There is no guarding or rebound.     Hernia: No hernia is present.     Comments: JP removed.  No evidence of  infection no peritonitis  Musculoskeletal:     Cervical back: Normal range of motion and neck supple. No rigidity.  Skin:    General: Skin is warm and dry.     Capillary Refill: Capillary refill takes less than 2 seconds.  Neurological:     General: No focal deficit present.     Mental Status: He is alert and oriented to person, place, and time.  Psychiatric:        Mood and Affect: Mood normal.        Behavior: Behavior normal.        Thought Content: Thought content normal.        Judgment: Judgment normal.    Assessment/Plan: . Recurrent complicated diverticulitis requiring hospitalization and drain placement x2.  Discussed with the patient in detail about ideally we will do another colonoscopy and then schedule his surgery.  He is very hesitant about having to wait for analysis colonoscopy and about the perforation risk and rather do the surgery upfront.  I will like to wait a few more weeks before performing a formal sigmoid colectomy.  I will see him back in 2 to 3 weeks and then we will discuss surgical intervention.  Currently he is improving and no need for emergent surgical intervention at  this time  Greater than 50% of the 15 minutes  visit was spent in counseling/coordination of care   Caroleen Hamman, MD [facility] Surgeon

## 2019-08-07 NOTE — Patient Instructions (Signed)
Please see your follow up appointment listed below.  °

## 2019-08-19 ENCOUNTER — Ambulatory Visit: Payer: Self-pay | Attending: Internal Medicine

## 2019-08-19 DIAGNOSIS — Z23 Encounter for immunization: Secondary | ICD-10-CM

## 2019-08-19 NOTE — Progress Notes (Signed)
   Covid-19 Vaccination Clinic  Name:  Dale Schwartz    MRN: [id number] DOB: 01-15-1986  08/19/2019  Mr. Dale Schwartz was observed post Covid-19 immunization for 15 minutes without incident. He was provided with Vaccine Information Sheet and instruction to access the V-Safe system.   Mr. Dale Schwartz was instructed to call 911 with any severe reactions post vaccine: Marland Kitchen Difficulty breathing  . Swelling of face and throat  . A fast heartbeat  . A bad rash all over body  . Dizziness and weakness   Immunizations Administered    Name Date Dose VIS Date Route   Pfizer COVID-19 Vaccine 08/19/2019  3:51 PM 0.3 mL 05/12/2019 Intramuscular   Manufacturer: [employer], [employer]   Lot: [id number]   NDC: [address]-4

## 2019-08-28 ENCOUNTER — Other Ambulatory Visit: Payer: Self-pay

## 2019-08-28 ENCOUNTER — Ambulatory Visit (INDEPENDENT_AMBULATORY_CARE_PROVIDER_SITE_OTHER): Payer: Self-pay | Admitting: Surgery

## 2019-08-28 ENCOUNTER — Encounter: Payer: Self-pay | Admitting: Surgery

## 2019-08-28 VITALS — BP 147/103 | HR 81 | Temp 90.3°F | Ht 68.0 in | Wt 241.6 lb

## 2019-08-28 DIAGNOSIS — K5720 Diverticulitis of large intestine with perforation and abscess without bleeding: Secondary | ICD-10-CM

## 2019-08-28 MED ORDER — BISACODYL 5 MG PO TBEC: DELAYED_RELEASE_TABLET | ORAL | 0 refills | Status: DC

## 2019-08-28 MED ORDER — NEOMYCIN SULFATE 500 MG PO TABS: ORAL_TABLET | ORAL | 0 refills | Status: DC

## 2019-08-28 MED ORDER — ERYTHROMYCIN BASE 500 MG PO TABS: ORAL_TABLET | ORAL | 0 refills | Status: DC

## 2019-08-28 MED ORDER — POLYETHYLENE GLYCOL 3350 17 GM/SCOOP PO POWD: ORAL | 0 refills | Status: DC

## 2019-08-28 NOTE — Progress Notes (Signed)
Outpatient Surgical Follow Up  08/28/2019  Dale Schwartz is an 34 y.o. male.   Chief Complaint  Patient presents with  . Follow-up    Follow up: Diverticulitis with abscess    HPI: 34-year-old male well-known to me with history of complicated diverticulitis with abscess requiring percutaneous drain placement x2.  More recently 5 weeks ago had the most recent hospitalization and attack.  He has recovered well since then.  He was unable to complete his colonoscopy due to a short recurrence of his disease.  He continues to have some intermittent abdominal pain at that area gets worse depending on what kind of food he eats.  He denies any fevers any chills.  Is having normal bowel movements.  He has his strength back.  He is able to perform more than 4 METS of activity without any shortness of breath or chest pain.  He is very afraid of another hospitalization or even free rupture that may require a Hartman's procedure. I know that this is now a chronic diverticulitis with multiple and close recurrent episodes that are complicated.  Due to this circumstance it has been limited from what we are able to do.  We have not been able to accomplish a full colonoscopy due to the frequency of his attacks.  I had explained the situation to him in detail and he is afraid that something really bad is going to happen and wants to have his surgery as soon as possible.  History reviewed. No pertinent past medical history.  History reviewed. No pertinent surgical history.  History reviewed. No pertinent family history.  Social History:  reports that he has never smoked. He has never used smokeless tobacco. He reports that he does not drink alcohol or use drugs.  Allergies: No Known Allergies  Medications reviewed.    ROS Full ROS performed and is otherwise negative other than what is stated in HPI   BP (!) 147/103   Pulse 81   Temp (!) 90.3 F (32.4 C) (Temporal)   Ht 5' 8" (1.727 m)   Wt  241 lb 9.6 oz (109.6 kg)   SpO2 97%   BMI 36.74 kg/m   Physical Exam Vitals and nursing note reviewed. Exam conducted with a chaperone present.  Constitutional:      General: He is not in acute distress.    Appearance: Normal appearance. He is normal weight.  Eyes:     General: No scleral icterus.       Right eye: No discharge.        Left eye: No discharge.  Cardiovascular:     Rate and Rhythm: Normal rate and regular rhythm.     Heart sounds: No murmur. No friction rub.  Pulmonary:     Effort: Pulmonary effort is normal. No respiratory distress.     Breath sounds: Normal breath sounds. No stridor. No wheezing or rhonchi.  Abdominal:     General: Abdomen is flat. There is no distension.     Palpations: Abdomen is soft. There is no mass.     Tenderness: There is no abdominal tenderness. There is no guarding or rebound.     Hernia: No hernia is present.  Musculoskeletal:        General: No swelling or tenderness. Normal range of motion.     Cervical back: Normal range of motion and neck supple. No rigidity.  Skin:    General: Skin is warm and dry.     Capillary Refill: Capillary refill   takes less than 2 seconds.     Coloration: Skin is not jaundiced.  Neurological:     General: No focal deficit present.     Mental Status: He is alert and oriented to person, place, and time.  Psychiatric:        Mood and Affect: Mood normal.        Behavior: Behavior normal.        Thought Content: Thought content normal.        Judgment: Judgment normal.      Assessment/Plan: 34 year old male with recurrent complicated diverticulitis with abscess.  He definitely needs a sigmoidectomy.  He continues to have this recurrent episodes and more of a chronic diverticulitis nature.  He is very fearful that he may end up back in the hospital again with another abscess or a true free perforation.  He wants to have his surgery done as soon as possible.  I concur with him that this is more of a  chronic nature and unfortunately we cannot predict which way he is going to go.  Right now he is in no distress not septic and in optimal conditions.  He wishes to have the surgery as soon as possible.  I am okay with doing the procedure next week.  I do think that he is a good candidate for robotic approach.  Procedure discussed with the patient detail.  Risk, benefits and possible implications including but not limited to: Bleeding, infection, anastomotic leak, persistent pain and injury to adjacent structures were discussed with patient in detail.  He understands and wishes to proceed Plan for robotic sigmoid colectomy, possible ostomy   Greater than 50% of the 40 minutes  visit was spent in counseling/coordination of care   Sterling Big, MD [facility] General Surgeon

## 2019-08-28 NOTE — H&P (View-Only) (Signed)
Outpatient Surgical Follow Up  08/28/2019  Dale Schwartz is an 34 y.o. male.   Chief Complaint  Patient presents with  . Follow-up    Follow up: Diverticulitis with abscess    HPI: 34 year old male well-known to me with history of complicated diverticulitis with abscess requiring percutaneous drain placement x2.  More recently 5 weeks ago had the most recent hospitalization and attack.  He has recovered well since then.  He was unable to complete his colonoscopy due to a short recurrence of his disease.  He continues to have some intermittent abdominal pain at that area gets worse depending on what kind of food he eats.  He denies any fevers any chills.  Is having normal bowel movements.  He has his strength back.  He is able to perform more than 4 METS of activity without any shortness of breath or chest pain.  He is very afraid of another hospitalization or even free rupture that may require a Hartman's procedure. I know that this is now a chronic diverticulitis with multiple and close recurrent episodes that are complicated.  Due to this circumstance it has been limited from what we are able to do.  We have not been able to accomplish a full colonoscopy due to the frequency of his attacks.  I had explained the situation to him in detail and he is afraid that something really bad is going to happen and wants to have his surgery as soon as possible.  History reviewed. No pertinent past medical history.  History reviewed. No pertinent surgical history.  History reviewed. No pertinent family history.  Social History:  reports that he has never smoked. He has never used smokeless tobacco. He reports that he does not drink alcohol or use drugs.  Allergies: No Known Allergies  Medications reviewed.    ROS Full ROS performed and is otherwise negative other than what is stated in HPI   BP (!) 147/103   Pulse 81   Temp (!) 90.3 F (32.4 C) (Temporal)   Ht 5\' 8"  (1.727 m)   Wt  241 lb 9.6 oz (109.6 kg)   SpO2 97%   BMI 36.74 kg/m   Physical Exam Vitals and nursing note reviewed. Exam conducted with a chaperone present.  Constitutional:      General: He is not in acute distress.    Appearance: Normal appearance. He is normal weight.  Eyes:     General: No scleral icterus.       Right eye: No discharge.        Left eye: No discharge.  Cardiovascular:     Rate and Rhythm: Normal rate and regular rhythm.     Heart sounds: No murmur. No friction rub.  Pulmonary:     Effort: Pulmonary effort is normal. No respiratory distress.     Breath sounds: Normal breath sounds. No stridor. No wheezing or rhonchi.  Abdominal:     General: Abdomen is flat. There is no distension.     Palpations: Abdomen is soft. There is no mass.     Tenderness: There is no abdominal tenderness. There is no guarding or rebound.     Hernia: No hernia is present.  Musculoskeletal:        General: No swelling or tenderness. Normal range of motion.     Cervical back: Normal range of motion and neck supple. No rigidity.  Skin:    General: Skin is warm and dry.     Capillary Refill: Capillary refill  takes less than 2 seconds.     Coloration: Skin is not jaundiced.  Neurological:     General: No focal deficit present.     Mental Status: He is alert and oriented to person, place, and time.  Psychiatric:        Mood and Affect: Mood normal.        Behavior: Behavior normal.        Thought Content: Thought content normal.        Judgment: Judgment normal.      Assessment/Plan: 34 year old male with recurrent complicated diverticulitis with abscess.  He definitely needs a sigmoidectomy.  He continues to have this recurrent episodes and more of a chronic diverticulitis nature.  He is very fearful that he may end up back in the hospital again with another abscess or a true free perforation.  He wants to have his surgery done as soon as possible.  I concur with him that this is more of a  chronic nature and unfortunately we cannot predict which way he is going to go.  Right now he is in no distress not septic and in optimal conditions.  He wishes to have the surgery as soon as possible.  I am okay with doing the procedure next week.  I do think that he is a good candidate for robotic approach.  Procedure discussed with the patient detail.  Risk, benefits and possible implications including but not limited to: Bleeding, infection, anastomotic leak, persistent pain and injury to adjacent structures were discussed with patient in detail.  He understands and wishes to proceed Plan for robotic sigmoid colectomy, possible ostomy   Greater than 50% of the 40 minutes  visit was spent in counseling/coordination of care   Sterling Big, MD [facility] General Surgeon

## 2019-08-28 NOTE — Patient Instructions (Addendum)
Nuestra programadora de cirugas Stacy se comunicar con usted en las prximas 24 a 48 horas. Durante esa llamada, Stacy discutir la preparacin antes de la ciruga y tambin discutir las diferentes fechas y horarios. Tenga la hoja AZUL disponible cuando se comunique con usted. Si tiene alguna pregunta o inquietud, no dude en llamar a nuestra oficina.  El paciente recibi informacin sobre la preparacin intestinal en la visita de hoy.

## 2019-08-30 ENCOUNTER — Telehealth: Payer: Self-pay | Admitting: Surgery

## 2019-08-30 NOTE — Telephone Encounter (Signed)
Outbound Spanish-speaking call made by JM, CMA to provide the following info:  Surgery Date: 09/05/19 Preadmission Testing Date: 08/31/19 (phone 8a-1p) Covid Testing Date: 09/01/19 - patient [facility] [facility]) between 8a-1p  Patient has been made aware to call [phone], between 1-3:00pm the day before surgery, to find out what time to arrive for surgery.

## 2019-08-31 ENCOUNTER — Encounter
Admission: RE | Admit: 2019-08-31 | Discharge: 2019-08-31 | Disposition: A | Discharge status: Home / Self Care | Payer: MEDICAID | Source: Ambulatory Visit | Attending: Surgery | Admitting: Surgery

## 2019-08-31 ENCOUNTER — Other Ambulatory Visit: Payer: Self-pay

## 2019-08-31 DIAGNOSIS — Z01818 Encounter for other preprocedural examination: Secondary | ICD-10-CM | POA: Insufficient documentation

## 2019-08-31 NOTE — Patient Instructions (Addendum)
Your procedure is scheduled on: Tuesday September 05, 2019 Su procedimiento est programado para: Martes 6 de Abril del 2021 Report to Day Surgery. Presntese a: Jaci Carrel  To find out your arrival time please call [phone] between 1PM - 3PM on Monday September 04, 2019. Para saber su hora de llegada por favor llame al [phone] entre la 1PM - 3PM el da: Lunes 5 de [place] del 2021  Remember: Instructions that are not followed completely may result in serious medical risk, up to and including death,  or upon the discretion of your surgeon and anesthesiologist your surgery may need to be rescheduled.  Recuerde: Las instrucciones que no se siguen completamente Armed forces logistics/support/administrative officer en un riesgo de salud grave, incluyendo hasta  la [place] o a discrecin de su cirujano y Scientific laboratory technician, su ciruga se puede posponer.   __X_ 1.Do not eat food after midnight the night before your procedure. No    gum chewing or hard candies. You may drink clear liquids up to 2 hours     before you are scheduled to arrive for your surgery- DO not drink clear     Liquids within 2 hours of the start of your surgery.     Clear Liquids include:    water, apple juice without pulp, clear carbohydrate drink such as    Clearfast of [place], Black Coffee or Tea (Do not add anything to coffee or tea).      No coma nada despus de la medianoche de la noche anterior a su    procedimiento. No coma chicles ni caramelos duros. Puede tomar    lquidos claros hasta 2 horas antes de su hora programada de llegada al     hospital para su procedimiento. No tome lquidos claros durante el     transcurso de las 2 horas de su llegada programada al hospital para su     procedimiento, ya que esto puede llevar a que su procedimiento se    retrase o tenga que volver a Magazine features editor.  Los lquidos claros incluyen:          - Agua o jugo de [place] sin pulpa          - Bebidas claras con carbohidratos como ClearFast o Gatorade  - Caf negro o t claro (sin leche, sin cremas, no agregue nada al caf ni al t)  No tome nada que no est en esta lista.  Los pacientes con diabetes tipo 1 y tipo 2 solo deben Printmaker.  Llame a la clnica de [facility] o a la unidad de Same Day Surgery si  tiene alguna pregunta sobre estas instrucciones.              _X__ 2.Do Not Smoke or use e-cigarettes For 24 Hours Prior to Your Surgery.    Do not use any chewable tobacco products for at least 6   hours prior to surgery.    No fume ni use cigarrillos electrnicos durante las 24 horas previas    a su [place].  No use ningn producto de tabaco masticable durante   al menos 6 horas antes de la [place].     __X_ 3. No alcohol for 24 hours before or after surgery.    No tome alcohol durante las 24 horas antes ni despus de la [place].   __x__ 5. Notify your doctor if there is any change in your medical condition (cold,fever, infections).    Informe a su mdico si hay algn cambio en su  condicin mdica  (resfriado, fiebre, infecciones).   Do not wear jewelry, make-up, hairpins, clips or nail polish.  No use joyas, maquillajes, pinzas/ganchos para el cabello ni esmalte de uas.  Do not wear lotions, powders, or perfumes. You may wear deodorant.  No use lociones, polvos o perfumes.  Puede usar desodorante.    Do not shave 48 hours prior to surgery. Men may shave face and neck.  No se afeite 48 horas antes de la [place].  Los hombres pueden [employer] cara  y el cuello.   Do not bring valuables to the hospital.   No lleve objetos [facility] is not responsible for any belongings or valuables.  [place] no se hace responsable de ningn tipo de pertenencias u objetos de Geographical information systems officer.               Contacts, dentures or bridgework may not be worn into surgery.  Los lentes de [place], las dentaduras postizas o puentes no se pueden usar en la [place].   Leave your suitcase in the car. After surgery it may be brought  to your room.  Deje su maleta en el auto.  Despus de la ciruga podr traerla a su habitacin.   For patients admitted to the hospital, discharge time is determined by your  treatment team.  Para los pacientes que sean ingresados al hospital, el tiempo en el cual se le  dar de alta es determinado por su equipo de [place].   Patients discharged the day of surgery will not be allowed to drive home. A los pacientes que se les da de alta el mismo da de la ciruga no se les permitir conducir a Holiday representative.   Please read over the following fact sheets that you were given: Por favor [place] informacin que le dieron:     __x__ Take these medicines the morning of surgery with A SIP OF WATER:          [employer] medicinas la maana de la ciruga con UN SORBO DE AGUA:  1. ninguna    __x__ Use CHG Soap as directed          Utilice el jabn de CHG segn lo indicado  __x__ Stop Anti-inflammatories such as ibuprofen, Aleve, naproxen, aspirin and or BC powders          Deje de tomar antiinflamatorios como: ibuprofen, Aleve, naproxen, aspirinas y polvos de BC powder.   __x__ Stop supplements until after surgery            Deje de tomar suplementos hasta despus de la ciruga  __x__ Do not start herbal supplements before your surgery.  No comience a tomar suplementos de hierbas antes de su cirugia.

## 2019-09-01 ENCOUNTER — Other Ambulatory Visit
Admission: RE | Admit: 2019-09-01 | Discharge: 2019-09-01 | Disposition: A | Discharge status: Home / Self Care | Payer: Self-pay | Source: Ambulatory Visit | Attending: Surgery | Admitting: Surgery

## 2019-09-01 DIAGNOSIS — Z01812 Encounter for preprocedural laboratory examination: Secondary | ICD-10-CM | POA: Insufficient documentation

## 2019-09-01 DIAGNOSIS — Z20822 Contact with and (suspected) exposure to covid-19: Secondary | ICD-10-CM | POA: Insufficient documentation

## 2019-09-01 LAB — SARS CORONAVIRUS 2 (TAT 6-24 HRS): SARS Coronavirus 2: NEGATIVE

## 2019-09-04 ENCOUNTER — Telehealth: Payer: Self-pay | Admitting: Emergency Medicine

## 2019-09-04 NOTE — Telephone Encounter (Signed)
Pt informed of negative covid test. Pt voiced understanding.

## 2019-09-05 ENCOUNTER — Encounter: Payer: Self-pay | Admitting: Surgery

## 2019-09-05 ENCOUNTER — Inpatient Hospital Stay: Payer: Self-pay | Admitting: Certified Registered"

## 2019-09-05 ENCOUNTER — Other Ambulatory Visit: Payer: Self-pay

## 2019-09-05 ENCOUNTER — Encounter
Admission: RE | Disposition: A | Discharge status: Home / Self Care | Payer: Self-pay | Source: Home / Self Care | Attending: Surgery

## 2019-09-05 ENCOUNTER — Inpatient Hospital Stay
Admission: RE | Admit: 2019-09-05 | Discharge: 2019-09-07 | DRG: 331 | Disposition: A | Discharge status: Home / Self Care | Payer: Self-pay | Attending: Surgery | Admitting: Surgery

## 2019-09-05 DIAGNOSIS — K37 Unspecified appendicitis: Secondary | ICD-10-CM | POA: Diagnosis present

## 2019-09-05 DIAGNOSIS — K5720 Diverticulitis of large intestine with perforation and abscess without bleeding: Principal | ICD-10-CM | POA: Diagnosis present

## 2019-09-05 DIAGNOSIS — Z9889 Other specified postprocedural states: Secondary | ICD-10-CM

## 2019-09-05 DIAGNOSIS — K5732 Diverticulitis of large intestine without perforation or abscess without bleeding: Secondary | ICD-10-CM | POA: Diagnosis present

## 2019-09-05 HISTORY — PX: XI ROBOTIC LAPAROSCOPIC ASSISTED APPENDECTOMY: SHX6877

## 2019-09-05 LAB — CREATININE, SERUM
Creatinine, Ser: 1.41 mg/dL — ABNORMAL HIGH (ref 0.61–1.24)
GFR calc Af Amer: >60 mL/min (ref >60)
GFR calc non Af Amer: >60 mL/min (ref >60)

## 2019-09-05 LAB — CBC
HCT: 34.2 % — ABNORMAL LOW (ref 39.0–52.0)
Hemoglobin: 11.5 g/dL — ABNORMAL LOW (ref 13.0–17.0)
MCH: 27.9 pg (ref 26.0–34.0)
MCHC: 33.6 g/dL (ref 30.0–36.0)
MCV: 83 fL (ref 80.0–100.0)
Platelets: 299 10*3/uL (ref 150–400)
RBC: 4.12 MIL/uL — ABNORMAL LOW (ref 4.22–5.81)
RDW: 13.8 % (ref 11.5–15.5)
WBC: 14.7 10*3/uL — ABNORMAL HIGH (ref 4.0–10.5)
nRBC: 0 % (ref 0.0–0.2)

## 2019-09-05 SURGERY — COLECTOMY, PARTIAL, ROBOT-ASSISTED, LAPAROSCOPIC
Anesthesia: General | Site: Abdomen

## 2019-09-05 MED ORDER — MIDAZOLAM HCL 2 MG/2ML IJ SOLN
INTRAMUSCULAR | Status: DC | PRN
  Administered 2019-09-05: 2 mg via INTRAVENOUS

## 2019-09-05 MED ORDER — SODIUM CHLORIDE 0.9 % IV SOLN
2.0000 g | INTRAVENOUS | Status: DC
  Administered 2019-09-06 – 2019-09-07 (×2): 2 g via INTRAVENOUS
  Filled 2019-09-05 (×3): qty 20

## 2019-09-05 MED ORDER — ROCURONIUM BROMIDE 10 MG/ML (PF) SYRINGE
PREFILLED_SYRINGE | INTRAVENOUS | Status: AC
  Filled 2019-09-05: qty 10

## 2019-09-05 MED ORDER — INDOCYANINE GREEN 25 MG IV SOLR
INTRAVENOUS | Status: DC | PRN
  Administered 2019-09-05: 5 mg via INTRAVENOUS

## 2019-09-05 MED ORDER — ALBUMIN HUMAN 5 % IV SOLN: INTRAVENOUS | Status: DC | PRN

## 2019-09-05 MED ORDER — PROMETHAZINE HCL 25 MG/ML IJ SOLN: 6.2500 mg | INTRAMUSCULAR | Status: DC | PRN

## 2019-09-05 MED ORDER — EPHEDRINE SULFATE 50 MG/ML IJ SOLN
INTRAMUSCULAR | Status: DC | PRN
  Administered 2019-09-05: 10 mg via INTRAVENOUS

## 2019-09-05 MED ORDER — ACETAMINOPHEN 500 MG PO TABS
1000.0000 mg | ORAL_TABLET | Freq: Four times a day (QID) | ORAL | Status: DC
  Administered 2019-09-05 – 2019-09-07 (×4): 1000 mg via ORAL
  Filled 2019-09-05 (×5): qty 2

## 2019-09-05 MED ORDER — SODIUM CHLORIDE (PF) 0.9 % IJ SOLN
INTRAMUSCULAR | Status: AC
  Filled 2019-09-05: qty 50

## 2019-09-05 MED ORDER — BUPIVACAINE-EPINEPHRINE 0.5% -1:200000 IJ SOLN
INTRAMUSCULAR | Status: DC | PRN
  Administered 2019-09-05: 20 mL

## 2019-09-05 MED ORDER — MIDAZOLAM HCL 2 MG/2ML IJ SOLN
INTRAMUSCULAR | Status: AC
  Filled 2019-09-05: qty 2

## 2019-09-05 MED ORDER — SUGAMMADEX SODIUM 200 MG/2ML IV SOLN
INTRAVENOUS | Status: DC | PRN
  Administered 2019-09-05: 220 mg via INTRAVENOUS

## 2019-09-05 MED ORDER — SODIUM CHLORIDE 0.9 % IV SOLN
2.0000 g | INTRAVENOUS | Status: AC
  Administered 2019-09-05: 2 g via INTRAVENOUS
  Filled 2019-09-05: qty 2

## 2019-09-05 MED ORDER — CHLORHEXIDINE GLUCONATE CLOTH 2 % EX PADS
6.0000 | MEDICATED_PAD | Freq: Once | CUTANEOUS | Status: AC
  Administered 2019-09-05: 6 via TOPICAL

## 2019-09-05 MED ORDER — METOPROLOL TARTRATE 5 MG/5ML IV SOLN
INTRAVENOUS | Status: DC | PRN
  Administered 2019-09-05: 2.5 mg via INTRAVENOUS

## 2019-09-05 MED ORDER — ACETAMINOPHEN 10 MG/ML IV SOLN
INTRAVENOUS | Status: AC
  Filled 2019-09-05: qty 100

## 2019-09-05 MED ORDER — PROPOFOL 10 MG/ML IV BOLUS
INTRAVENOUS | Status: AC
  Filled 2019-09-05: qty 40

## 2019-09-05 MED ORDER — ALBUMIN HUMAN 5 % IV SOLN
INTRAVENOUS | Status: AC
  Filled 2019-09-05: qty 250

## 2019-09-05 MED ORDER — ONDANSETRON 4 MG PO TBDP: 4.0000 mg | ORAL_TABLET | Freq: Four times a day (QID) | ORAL | Status: DC | PRN

## 2019-09-05 MED ORDER — METRONIDAZOLE IN NACL 5-0.79 MG/ML-% IV SOLN
500.0000 mg | Freq: Three times a day (TID) | INTRAVENOUS | Status: DC
  Administered 2019-09-05 – 2019-09-07 (×6): 500 mg via INTRAVENOUS
  Filled 2019-09-05 (×9): qty 100

## 2019-09-05 MED ORDER — BUPIVACAINE LIPOSOME 1.3 % IJ SUSP
INTRAMUSCULAR | Status: DC | PRN
  Administered 2019-09-05: 20 mL

## 2019-09-05 MED ORDER — HEPARIN SODIUM (PORCINE) 5000 UNIT/ML IJ SOLN: 5000.0000 [IU] | Freq: Once | INTRAMUSCULAR | Status: AC

## 2019-09-05 MED ORDER — ACETAMINOPHEN 500 MG PO TABS
ORAL_TABLET | ORAL | Status: AC
  Administered 2019-09-05: 10:00:00 1000 mg via ORAL
  Filled 2019-09-05: qty 2

## 2019-09-05 MED ORDER — DEXAMETHASONE SODIUM PHOSPHATE 10 MG/ML IJ SOLN
INTRAMUSCULAR | Status: DC | PRN
  Administered 2019-09-05: 5 mg via INTRAVENOUS

## 2019-09-05 MED ORDER — OXYCODONE HCL 5 MG/5ML PO SOLN: 5.0000 mg | Freq: Once | ORAL | Status: DC | PRN

## 2019-09-05 MED ORDER — LIDOCAINE 5 % EX PTCH
1.0000 | MEDICATED_PATCH | Freq: Every day | CUTANEOUS | Status: DC | PRN
  Filled 2019-09-05: qty 1

## 2019-09-05 MED ORDER — SODIUM CHLORIDE 0.9 % IV SOLN: INTRAVENOUS | Status: DC

## 2019-09-05 MED ORDER — PROCHLORPERAZINE EDISYLATE 10 MG/2ML IJ SOLN: 5.0000 mg | Freq: Four times a day (QID) | INTRAMUSCULAR | Status: DC | PRN

## 2019-09-05 MED ORDER — HEPARIN SODIUM (PORCINE) 5000 UNIT/ML IJ SOLN
INTRAMUSCULAR | Status: AC
  Administered 2019-09-05: 5000 [IU] via SUBCUTANEOUS
  Filled 2019-09-05: qty 1

## 2019-09-05 MED ORDER — FENTANYL CITRATE (PF) 100 MCG/2ML IJ SOLN
INTRAMUSCULAR | Status: AC
  Filled 2019-09-05: qty 2

## 2019-09-05 MED ORDER — MORPHINE SULFATE (PF) 2 MG/ML IV SOLN: 2.0000 mg | INTRAVENOUS | Status: DC | PRN

## 2019-09-05 MED ORDER — ONDANSETRON HCL 4 MG/2ML IJ SOLN: 4.0000 mg | Freq: Four times a day (QID) | INTRAMUSCULAR | Status: DC | PRN

## 2019-09-05 MED ORDER — FENTANYL CITRATE (PF) 250 MCG/5ML IJ SOLN
INTRAMUSCULAR | Status: AC
  Filled 2019-09-05: qty 5

## 2019-09-05 MED ORDER — BUPIVACAINE-EPINEPHRINE (PF) 0.5% -1:200000 IJ SOLN
INTRAMUSCULAR | Status: AC
  Filled 2019-09-05: qty 30

## 2019-09-05 MED ORDER — OXYCODONE HCL 5 MG PO TABS: 5.0000 mg | ORAL_TABLET | Freq: Once | ORAL | Status: DC | PRN

## 2019-09-05 MED ORDER — ALVIMOPAN 12 MG PO CAPS: 12.0000 mg | ORAL_CAPSULE | ORAL | Status: AC

## 2019-09-05 MED ORDER — SODIUM CHLORIDE 0.9 % IV SOLN
INTRAVENOUS | Status: DC | PRN
  Administered 2019-09-05: 50 ug/min via INTRAVENOUS

## 2019-09-05 MED ORDER — GABAPENTIN 300 MG PO CAPS
ORAL_CAPSULE | ORAL | Status: AC
  Administered 2019-09-05: 300 mg via ORAL
  Filled 2019-09-05: qty 1

## 2019-09-05 MED ORDER — ROCURONIUM BROMIDE 100 MG/10ML IV SOLN
INTRAVENOUS | Status: DC | PRN
  Administered 2019-09-05: 20 mg via INTRAVENOUS
  Administered 2019-09-05: 50 mg via INTRAVENOUS
  Administered 2019-09-05: 20 mg via INTRAVENOUS
  Administered 2019-09-05: 30 mg via INTRAVENOUS
  Administered 2019-09-05 (×2): 20 mg via INTRAVENOUS
  Administered 2019-09-05: 30 mg via INTRAVENOUS
  Administered 2019-09-05: 20 mg via INTRAVENOUS

## 2019-09-05 MED ORDER — PHENYLEPHRINE HCL (PRESSORS) 10 MG/ML IV SOLN
INTRAVENOUS | Status: DC | PRN
  Administered 2019-09-05: 100 ug via INTRAVENOUS
  Administered 2019-09-05 (×3): 200 ug via INTRAVENOUS
  Administered 2019-09-05: 100 ug via INTRAVENOUS

## 2019-09-05 MED ORDER — FENTANYL CITRATE (PF) 100 MCG/2ML IJ SOLN
INTRAMUSCULAR | Status: AC
  Administered 2019-09-05: 22:00:00 25 ug via INTRAVENOUS
  Filled 2019-09-05: qty 2

## 2019-09-05 MED ORDER — HYDRALAZINE HCL 20 MG/ML IJ SOLN: 10.0000 mg | INTRAMUSCULAR | Status: DC | PRN

## 2019-09-05 MED ORDER — ALVIMOPAN 12 MG PO CAPS
12.0000 mg | ORAL_CAPSULE | ORAL | Status: AC
  Administered 2019-09-06: 12 mg via ORAL
  Filled 2019-09-05: qty 1

## 2019-09-05 MED ORDER — ENOXAPARIN SODIUM 40 MG/0.4ML ~~LOC~~ SOLN
40.0000 mg | SUBCUTANEOUS | Status: DC
  Administered 2019-09-06 – 2019-09-07 (×2): 40 mg via SUBCUTANEOUS
  Filled 2019-09-05 (×2): qty 0.4

## 2019-09-05 MED ORDER — GABAPENTIN 300 MG PO CAPS: 300.0000 mg | ORAL_CAPSULE | ORAL | Status: AC

## 2019-09-05 MED ORDER — PANTOPRAZOLE SODIUM 40 MG IV SOLR
40.0000 mg | Freq: Every day | INTRAVENOUS | Status: DC
  Administered 2019-09-05 – 2019-09-06 (×2): 40 mg via INTRAVENOUS
  Filled 2019-09-05 (×2): qty 40

## 2019-09-05 MED ORDER — FENTANYL CITRATE (PF) 100 MCG/2ML IJ SOLN
25.0000 ug | INTRAMUSCULAR | Status: DC | PRN
  Administered 2019-09-05 (×3): 25 ug via INTRAVENOUS

## 2019-09-05 MED ORDER — PROCHLORPERAZINE MALEATE 10 MG PO TABS
10.0000 mg | ORAL_TABLET | Freq: Four times a day (QID) | ORAL | Status: DC | PRN
  Filled 2019-09-05: qty 1

## 2019-09-05 MED ORDER — ACETAMINOPHEN 500 MG PO TABS: 1000.0000 mg | ORAL_TABLET | ORAL | Status: AC

## 2019-09-05 MED ORDER — PROPOFOL 10 MG/ML IV BOLUS
INTRAVENOUS | Status: DC | PRN
  Administered 2019-09-05: 200 mg via INTRAVENOUS

## 2019-09-05 MED ORDER — FAMOTIDINE 20 MG PO TABS
ORAL_TABLET | ORAL | Status: AC
  Administered 2019-09-05: 10:00:00 20 mg via ORAL
  Filled 2019-09-05: qty 1

## 2019-09-05 MED ORDER — CHLORHEXIDINE GLUCONATE CLOTH 2 % EX PADS
6.0000 | MEDICATED_PAD | Freq: Once | CUTANEOUS | Status: AC
  Administered 2019-09-05: 10:00:00 6 via TOPICAL

## 2019-09-05 MED ORDER — CHLORHEXIDINE GLUCONATE CLOTH 2 % EX PADS
6.0000 | MEDICATED_PAD | Freq: Every day | CUTANEOUS | Status: DC
  Administered 2019-09-06 – 2019-09-07 (×2): 6 via TOPICAL

## 2019-09-05 MED ORDER — KETOROLAC TROMETHAMINE 30 MG/ML IJ SOLN
30.0000 mg | Freq: Four times a day (QID) | INTRAMUSCULAR | Status: DC
  Administered 2019-09-05 – 2019-09-07 (×6): 30 mg via INTRAVENOUS
  Filled 2019-09-05 (×6): qty 1

## 2019-09-05 MED ORDER — PREGABALIN 50 MG PO CAPS
100.0000 mg | ORAL_CAPSULE | Freq: Three times a day (TID) | ORAL | Status: DC
  Administered 2019-09-05 – 2019-09-07 (×6): 100 mg via ORAL
  Filled 2019-09-05 (×6): qty 2

## 2019-09-05 MED ORDER — KETAMINE HCL 50 MG/ML IJ SOLN
INTRAMUSCULAR | Status: AC
  Filled 2019-09-05: qty 10

## 2019-09-05 MED ORDER — LIDOCAINE HCL (PF) 2 % IJ SOLN
INTRAMUSCULAR | Status: AC
  Filled 2019-09-05: qty 5

## 2019-09-05 MED ORDER — OXYCODONE HCL 5 MG PO TABS
5.0000 mg | ORAL_TABLET | ORAL | Status: DC | PRN
  Administered 2019-09-06: 10 mg via ORAL
  Administered 2019-09-07: 5 mg via ORAL
  Administered 2019-09-07 (×2): 10 mg via ORAL
  Filled 2019-09-05: qty 1
  Filled 2019-09-05 (×3): qty 2

## 2019-09-05 MED ORDER — LACTATED RINGERS IV SOLN: INTRAVENOUS | Status: DC

## 2019-09-05 MED ORDER — FENTANYL CITRATE (PF) 100 MCG/2ML IJ SOLN
INTRAMUSCULAR | Status: DC | PRN
  Administered 2019-09-05 (×3): 50 ug via INTRAVENOUS
  Administered 2019-09-05: 100 ug via INTRAVENOUS
  Administered 2019-09-05 (×2): 50 ug via INTRAVENOUS

## 2019-09-05 MED ORDER — CYCLOBENZAPRINE HCL 10 MG PO TABS
5.0000 mg | ORAL_TABLET | Freq: Three times a day (TID) | ORAL | Status: DC | PRN
  Administered 2019-09-07: 04:00:00 5 mg via ORAL
  Filled 2019-09-05: qty 1

## 2019-09-05 MED ORDER — ONDANSETRON HCL 4 MG/2ML IJ SOLN
INTRAMUSCULAR | Status: DC | PRN
  Administered 2019-09-05: 4 mg via INTRAVENOUS

## 2019-09-05 MED ORDER — CELECOXIB 200 MG PO CAPS
ORAL_CAPSULE | ORAL | Status: AC
  Administered 2019-09-05: 200 mg via ORAL
  Filled 2019-09-05: qty 1

## 2019-09-05 MED ORDER — ALVIMOPAN 12 MG PO CAPS
ORAL_CAPSULE | ORAL | Status: AC
  Administered 2019-09-05: 12 mg via ORAL
  Filled 2019-09-05: qty 1

## 2019-09-05 MED ORDER — KETAMINE HCL 50 MG/ML IJ SOLN
INTRAMUSCULAR | Status: DC | PRN
  Administered 2019-09-05 (×2): 10 mg via INTRAMUSCULAR
  Administered 2019-09-05: 30 mg via INTRAMUSCULAR
  Administered 2019-09-05: 10 mg via INTRAMUSCULAR

## 2019-09-05 MED ORDER — FAMOTIDINE 20 MG PO TABS: 20.0000 mg | ORAL_TABLET | Freq: Once | ORAL | Status: AC

## 2019-09-05 MED ORDER — LIDOCAINE HCL (CARDIAC) PF 100 MG/5ML IV SOSY
PREFILLED_SYRINGE | INTRAVENOUS | Status: DC | PRN
  Administered 2019-09-05 (×3): 40 mg via INTRAVENOUS
  Administered 2019-09-05: 100 mg via INTRAVENOUS
  Administered 2019-09-05: 40 mg via INTRAVENOUS

## 2019-09-05 MED ORDER — SODIUM CHLORIDE (PF) 0.9 % IJ SOLN
INTRAMUSCULAR | Status: DC | PRN
  Administered 2019-09-05: 60 mL via INTRAVENOUS

## 2019-09-05 MED ORDER — ACETAMINOPHEN 10 MG/ML IV SOLN
INTRAVENOUS | Status: DC | PRN
  Administered 2019-09-05: 1000 mg via INTRAVENOUS

## 2019-09-05 MED ORDER — CELECOXIB 200 MG PO CAPS: 200.0000 mg | ORAL_CAPSULE | ORAL | Status: AC

## 2019-09-05 MED ORDER — PHENYLEPHRINE HCL (PRESSORS) 10 MG/ML IV SOLN
INTRAVENOUS | Status: AC
  Filled 2019-09-05: qty 1

## 2019-09-05 MED ORDER — EPHEDRINE 5 MG/ML INJ
INTRAVENOUS | Status: AC
  Filled 2019-09-05: qty 10

## 2019-09-05 MED ORDER — BUPIVACAINE LIPOSOME 1.3 % IJ SUSP
INTRAMUSCULAR | Status: AC
  Filled 2019-09-05: qty 20

## 2019-09-05 SURGICAL SUPPLY — 111 items
ANCHOR TIS RET SYS 1550ML (BAG) IMPLANT
BULB RESERV EVAC DRAIN JP 100C (MISCELLANEOUS) ×2 IMPLANT
CANISTER SUCT 1200ML W/VALVE (MISCELLANEOUS) ×5 IMPLANT
CANNULA REDUC XI 12-8 STAPL (CANNULA) ×1
CANNULA REDUC XI 12-8MM STAPL (CANNULA) ×1
CANNULA REDUCER 12-8 DVNC XI (CANNULA) ×3 IMPLANT
CATH ROBINSON RED A/P 16FR (CATHETERS) ×5 IMPLANT
CHLORAPREP W/TINT 26 (MISCELLANEOUS) ×5 IMPLANT
CLIP VESOLOCK LG 6/CT PURPLE (CLIP) ×3 IMPLANT
CLIP VESOLOCK MED LG 6/CT (CLIP) ×3 IMPLANT
COVER MAYO STAND REUSABLE (DRAPES) ×5 IMPLANT
COVER TIP SHEARS 8 DVNC (MISCELLANEOUS) IMPLANT
COVER TIP SHEARS 8MM DA VINCI (MISCELLANEOUS) ×2
COVER WAND RF STERILE (DRAPES) ×5 IMPLANT
DECANTER SPIKE VIAL GLASS SM (MISCELLANEOUS) ×5 IMPLANT
DEFOGGER SCOPE WARMER CLEARIFY (MISCELLANEOUS) ×5 IMPLANT
DERMABOND ADVANCED (GAUZE/BANDAGES/DRESSINGS) ×2
DERMABOND ADVANCED .7 DNX12 (GAUZE/BANDAGES/DRESSINGS) ×3 IMPLANT
DRAIN CHANNEL JP 19F (MISCELLANEOUS) ×2 IMPLANT
DRAPE 3/4 80X56 (DRAPES) ×3 IMPLANT
DRAPE ARM DVNC X/XI (DISPOSABLE) ×12 IMPLANT
DRAPE COLUMN DVNC XI (DISPOSABLE) ×3 IMPLANT
DRAPE DA VINCI XI ARM (DISPOSABLE) ×8
DRAPE DA VINCI XI COLUMN (DISPOSABLE) ×2
DRAPE INCISE IOBAN 66X45 STRL (DRAPES) ×2 IMPLANT
DRAPE UNDER BUTTOCK W/FLU (DRAPES) ×5 IMPLANT
ELECT BLADE 6.5 EXT (BLADE) ×3 IMPLANT
ELECT CAUTERY BLADE 6.4 (BLADE) ×5 IMPLANT
ELECT REM PT RETURN 9FT ADLT (ELECTROSURGICAL) ×5
ELECTRODE REM PT RTRN 9FT ADLT (ELECTROSURGICAL) ×3 IMPLANT
GLOVE BIO SURGEON STRL SZ7 (GLOVE) ×15 IMPLANT
GOWN STRL REUS W/ TWL LRG LVL3 (GOWN DISPOSABLE) ×15 IMPLANT
GOWN STRL REUS W/TWL LRG LVL3 (GOWN DISPOSABLE) ×10
GRASPER LAPSCPC 5X45 DSP (INSTRUMENTS) ×5 IMPLANT
HANDLE YANKAUER SUCT BULB TIP (MISCELLANEOUS) ×5 IMPLANT
IRRIGATION STRYKERFLOW (MISCELLANEOUS) IMPLANT
IRRIGATOR STRYKERFLOW (MISCELLANEOUS) ×5
IV NS 1000ML (IV SOLUTION) ×2
IV NS 1000ML BAXH (IV SOLUTION) IMPLANT
KIT IMAGING PINPOINTPAQ (MISCELLANEOUS) ×3 IMPLANT
KIT PINK PAD W/HEAD ARE REST (MISCELLANEOUS) ×5
KIT PINK PAD W/HEAD ARM REST (MISCELLANEOUS) ×3 IMPLANT
LABEL OR SOLS (LABEL) ×5 IMPLANT
LEGGING LITHOTOMY PAIR STRL (DRAPES) ×5 IMPLANT
NEEDLE HYPO 22GX1.5 SAFETY (NEEDLE) ×5 IMPLANT
NS IRRIG 500ML POUR BTL (IV SOLUTION) ×5 IMPLANT
OBTURATOR 12 LNG DA VINCI REUS (INSTRUMENTS)
OBTURATOR 12 LNG REUSE DVNC (INSTRUMENTS) IMPLANT
OBTURATOR OPTICAL STANDARD 8MM (TROCAR) ×2
OBTURATOR OPTICAL STND 8 DVNC (TROCAR) ×3
OBTURATOR OPTICALSTD 8 DVNC (TROCAR) IMPLANT
PACK COLON CLEAN CLOSURE (MISCELLANEOUS) ×3 IMPLANT
PACK LAP CHOLECYSTECTOMY (MISCELLANEOUS) ×5 IMPLANT
PAD PREP 24X41 OB/GYN DISP (PERSONAL CARE ITEMS) ×5 IMPLANT
PENCIL ELECTRO HAND CTR (MISCELLANEOUS) ×5 IMPLANT
RELOAD STAPLE 45 3.5 BLU DVNC (STAPLE) IMPLANT
RELOAD STAPLE 60 2.5 WHT DVNC (STAPLE) IMPLANT
RELOAD STAPLE 60 3.5 BLU DVNC (STAPLE) IMPLANT
RELOAD STAPLER 2.5X60 WHT DVNC (STAPLE) IMPLANT
RELOAD STAPLER 3.5X45 BLU DVNC (STAPLE) IMPLANT
RELOAD STAPLER 3.5X60 BLU DVNC (STAPLE) ×6 IMPLANT
SEAL CANN UNIV 5-8 DVNC XI (MISCELLANEOUS) ×9 IMPLANT
SEAL XI 5MM-8MM UNIVERSAL (MISCELLANEOUS) ×6
SEALER VESSEL DA VINCI XI (MISCELLANEOUS) ×2
SEALER VESSEL EXT DVNC XI (MISCELLANEOUS) IMPLANT
SET TRI-LUMEN FLTR TB AIRSEAL (TUBING) ×5 IMPLANT
SOL PREP PVP 2OZ (MISCELLANEOUS) ×5
SOLUTION ELECTROLUBE (MISCELLANEOUS) ×5 IMPLANT
SOLUTION PREP PVP 2OZ (MISCELLANEOUS) ×3 IMPLANT
SPONGE LAP 18X18 RF (DISPOSABLE) ×8 IMPLANT
SPONGE LAP 4X18 RFD (DISPOSABLE) ×5 IMPLANT
STAPLER 45 DA VINCI SURE FORM (STAPLE)
STAPLER 45 SUREFORM DVNC (STAPLE) IMPLANT
STAPLER 60 DA VINCI SURE FORM (STAPLE) ×2
STAPLER 60 SUREFORM DVNC (STAPLE) IMPLANT
STAPLER CANNULA SEAL DVNC XI (STAPLE) ×3 IMPLANT
STAPLER CANNULA SEAL XI (STAPLE) ×2
STAPLER CIRCULAR 29MM (STAPLE) ×2 IMPLANT
STAPLER ENDO ILS CVD 18 33 (STAPLE) IMPLANT
STAPLER PROX 25M (MISCELLANEOUS) IMPLANT
STAPLER PROXIMATE 75MM BLUE (STAPLE) ×2 IMPLANT
STAPLER RELOAD 2.5X60 WHITE (STAPLE)
STAPLER RELOAD 2.5X60 WHT DVNC (STAPLE)
STAPLER RELOAD 3.5X45 BLU DVNC (STAPLE)
STAPLER RELOAD 3.5X45 BLUE (STAPLE)
STAPLER RELOAD 3.5X60 BLU DVNC (STAPLE) ×6
STAPLER RELOAD 3.5X60 BLUE (STAPLE) ×4
SURGILUBE 2OZ TUBE FLIPTOP (MISCELLANEOUS) ×5 IMPLANT
SUT DVC VLOC 3-0 CL 6 P-12 (SUTURE) ×3 IMPLANT
SUT ETHILON 3-0 FS-10 30 BLK (SUTURE) ×5
SUT MNCRL AB 4-0 PS2 18 (SUTURE) ×3 IMPLANT
SUT PDS AB 0 CT1 27 (SUTURE) ×6 IMPLANT
SUT SILK 2 0 (SUTURE) ×2
SUT SILK 2 0 SH (SUTURE) ×3 IMPLANT
SUT SILK 2 0SH CR/8 30 (SUTURE) ×2 IMPLANT
SUT SILK 2-0 30XBRD TIE 12 (SUTURE) ×3 IMPLANT
SUT VICRYL 0 AB UR-6 (SUTURE) ×3 IMPLANT
SUT VLOC 90 S/L VL9 GS22 (SUTURE) ×6 IMPLANT
SUTURE EHLN 3-0 FS-10 30 BLK (SUTURE) IMPLANT
SYR 20ML LL LF (SYRINGE) ×6 IMPLANT
SYR TOOMEY IRRIG 70ML (MISCELLANEOUS) ×5
SYRINGE TOOMEY IRRIG 70ML (MISCELLANEOUS) ×3 IMPLANT
SYS LAPSCP GELPORT 120MM (MISCELLANEOUS) ×5
SYS TROCAR 1.5-3 SLV ABD GEL (ENDOMECHANICALS) ×5
SYSTEM LAPSCP GELPORT 120MM (MISCELLANEOUS) IMPLANT
SYSTEM TROCR 1.5-3 SLV ABD GEL (ENDOMECHANICALS) ×3 IMPLANT
TAPE TRANSPORE STRL 2 31045 (GAUZE/BANDAGES/DRESSINGS) ×3 IMPLANT
TRAY FOLEY MTR SLVR 16FR STAT (SET/KITS/TRAYS/PACK) ×3 IMPLANT
TRAY FOLEY SLVR 16FR LF STAT (SET/KITS/TRAYS/PACK) ×5 IMPLANT
TROCAR BALLN GELPORT 12X130M (ENDOMECHANICALS) ×3 IMPLANT
TROCAR PORT AIRSEAL 8X100 (TROCAR) ×5 IMPLANT

## 2019-09-05 NOTE — Anesthesia Preprocedure Evaluation (Addendum)
Anesthesia Evaluation  Patient identified by MRN, date of birth, ID band Patient awake    Reviewed: Allergy & Precautions, H&P , NPO status , Patient's Chart, lab work & pertinent test results  Airway Mallampati: II  TM Distance: >3 FB Neck ROM: full    Dental  (+) Teeth Intact   Pulmonary neg pulmonary ROS, neg sleep apnea, neg recent URI, Not current smoker,    breath sounds clear to auscultation       Cardiovascular (-) angina(-) Past MI negative cardio ROS  (-) dysrhythmias  Rhythm:regular Rate:Normal     Neuro/Psych negative neurological ROS  negative psych ROS   GI/Hepatic neg GERD  ,(+)     (-) substance abuse  , diverticulosis   Endo/Other  negative endocrine ROSneg diabetes  Renal/GU      Musculoskeletal   Abdominal   Peds  Hematology negative hematology ROS (+)   Anesthesia Other Findings History reviewed. No pertinent past medical history.  History reviewed. No pertinent surgical history.     Reproductive/Obstetrics negative OB ROS                            Anesthesia Physical Anesthesia Plan  ASA: II  Anesthesia Plan: General ETT   Post-op Pain Management:    Induction:   PONV Risk Score and Plan: Ondansetron, Dexamethasone, Midazolam and Treatment may vary due to age or medical condition  Airway Management Planned:   Additional Equipment:   Intra-op Plan:   Post-operative Plan:   Informed Consent: I have reviewed the patients History and Physical, chart, labs and discussed the procedure including the risks, benefits and alternatives for the proposed anesthesia with the patient or authorized representative who has indicated his/her understanding and acceptance.     Dental Advisory Given  Plan Discussed with: Anesthesiologist  Anesthesia Plan Comments:        Anesthesia Quick Evaluation

## 2019-09-05 NOTE — Op Note (Addendum)
Robotic assisted laparoscopic Low anterior resection  Pre-operative Diagnosis: chronic complicated diverticulitis  Post-operative Diagnosis: same  Procedure:  Robotic assisted laparoscopic Low anterior resection with EEA anastomosis Open appendectomy Takedown of splenic flexure robotic assisted   Surgeon: Sterling Big, MD FACS  Anesthesia: Gen. with endotracheal tube  Findings: Tension Free anastomosis w/o intra op leak Good perfusion of anastomosis confirmed by ICG Chronic diverticulitis with phlegmon within pelvis Changes of Appendicitis Liley reactive in need for appendectomy ( True indication not incidental)  Assistant: Dr Lady Gary required due to the complexity of the case and for the anastomosis  Estimated Blood Loss: 25cc       Specimens: Colon  And appendix       Complications: none   Procedure Details  The patient was seen again in the Holding Room. The benefits, complications, treatment options, and expected outcomes were discussed with the patient. The risks of bleeding, infection, recurrence of symptoms, failure to resolve symptoms, anastomotic leak,bowel injury, any of which could require further surgery  were reviewed with the patient. The likelihood of improving the patient's symptoms with return to their baseline status is good.  The patient and/or family concurred with the proposed plan, giving informed consent.  The patient was taken to Operating Room, identified  and the procedure verified,  A Time Out was held and the above information confirmed.  Prior to the induction of general anesthesia, antibiotic prophylaxis was administered. VTE prophylaxis was in place. General endotracheal anesthesia was then administered and tolerated well. After the induction, the abdomen was prepped with Chloraprep and draped in the sterile fashion. The patient was positioned in the lithotomy position.  Cut down technique was used to enter the abdominal cavity in the Right lower  quadrant and a Mini port  was placed. Pneumoperitoneum was then created with CO2 and tolerated well without any adverse changes in the patient's vital signs.  Three 8-mm ports were placed under direct vision. AN extra assist port was placed and we used Airseal system.  The patient was positioned  in  Trendelenburg, robot was brought to the surgical field and docked in the standard fashion.  We made sure all the instrumentation was kept indirect view at all times and that there were no collision between the arms. I scrubbed out and went to the console.  We identified a pelvic phlegmon with severe inflammation within the sigmoid colon extending to the small bowel and appendix.  Because lack of tactile feedback I scrubbed back in and place a hand port. I finger fractured the adhesions between the sigmoid and small bowel, No true fistulas or injuries visualized. Appendix was inflamed and therefore an appendectomy was needed. Mesoappendix suture ligated and appendix divided with a 12mm stapler.  I went back to the console and redocked the Robot.   Attention then was turned to the left colic pedicle, the IMA, superior hemorrhoidal and left colic were isolated and ligated.  I was able to divide the pedicle in the standard fashion and I did that 3 times. The left ureter was identified and protected at all times.   Attention then was turned to the medial aspect of the mesentery.    At this point the vessel sealer was used to dissect the colon from a  Medial lateral  Fashion . The mesorectal dissection was then started , we stayed in the avascular plane and divided the mesorectum with vessel sealer. A good dissection of the rectum was performed and using 60 mm Stapler the rectum  was divided. We used the sigmoid as a handle and completed our lateral to medial dissection. The splenic felxure was mobilized. We selected a healthy piece of descending colon after infusion ICG and confirming good blood supply. The colon was  divided with a regular 35mm stapler.   The specimen extracted under direct visualization with a large laparoscopic bag.  The descending colon was brought outside the abdominal cavity and the staple line divided. I measured and inserted a 29 mm EEA device that was a perfect fit. An EEA anastomosis was created after Dr. Celine Ahr inserted the device transrectally. We performed a leak test and  There Was no evidence of any twisting or leak.  Anastomosis was widely patent with good perfusion and tension free. We undocked the robot   All the robotic ports were removed    At this point time I was able to use liposomal Marcaine to perform an abdominal block at all incision sites.  The anterior and posterior fascia was closed with a running 0 PDS sutures in the standard fashion.  All the skin incisions were closed with staples and sterile dressings were applied.  70 blake drain was placed in the pelvis due to chronic inflammatory response and Phlegmon. We will continue perioperative antibiotics die to active infection. The patient was then extubated and brought to the recovery room in stable condition. Sponge, lap, and needle counts were correct at closure and at the conclusion of the case.               Caroleen Hamman, MD, FACS

## 2019-09-05 NOTE — Interval H&P Note (Signed)
History and Physical Interval Note:  09/05/2019 3:00 PM  Dale Schwartz  has presented today for surgery, with the diagnosis of Diverticulitis.  The various methods of treatment have been discussed with the patient and family. After consideration of risks, benefits and other options for treatment, the patient has consented to  Procedure(s): XI ROBOT ASSISTED LAPAROSCOPIC PARTIAL Sigmoid COLECTOMY possible ostemy (N/A) as a surgical intervention.  The patient's history has been reviewed, patient examined, no change in status, stable for surgery.  I have reviewed the patient's chart and labs.  Questions were answered to the patient's satisfaction.     Dezirea Mccollister F Neel Buffone

## 2019-09-05 NOTE — Transfer of Care (Signed)
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  Procedure(s) Performed: XI ROBOT ASSISTED LAPAROSCOPIC PARTIAL Sigmoid COLECTOMY possible ostemy (N/A ) XI ROBOTIC LAPAROSCOPIC ASSISTED APPENDECTOMY (N/A Abdomen) INDOCYANINE GREEN FLUORESCENCE IMAGING (ICG)  Patient Location: PACU  Anesthesia Type:General  Level of Consciousness: sedated and patient cooperative  Airway & Oxygen Therapy: Patient Spontanous Breathing and Patient connected to face mask oxygen  Post-op Assessment: Report given to RN and Post -op Vital signs reviewed and stable  Post vital signs: Reviewed and stable  Last Vitals:  Vitals Value Taken Time  BP    Temp    Pulse 105 09/05/19 2142  Resp 18 09/05/19 2142  SpO2 100 % 09/05/19 2142  Vitals shown include unvalidated device data.  Last Pain:  Vitals:   09/05/19 0858  TempSrc: Temporal  PainSc: 0-No pain         Complications: No apparent anesthesia complications

## 2019-09-05 NOTE — Anesthesia Procedure Notes (Signed)
Procedure Name: Intubation Date/Time: 09/05/2019 3:46 PM Performed by: Clyde Lundborg, CRNA Pre-anesthesia Checklist: Patient identified, Emergency Drugs available, Suction available and Patient being monitored Patient Re-evaluated:Patient Re-evaluated prior to induction Oxygen Delivery Method: Circle system utilized Preoxygenation: Pre-oxygenation with 100% oxygen Induction Type: IV induction Ventilation: Mask ventilation without difficulty Laryngoscope Size: McGraph and 3 Grade View: Grade I Tube type: Oral Tube size: 7.5 mm Number of attempts: 1 Airway Equipment and Method: Video-laryngoscopy Placement Confirmation: ETT inserted through vocal cords under direct vision,  positive ETCO2 and breath sounds checked- equal and bilateral Secured at: 21 cm Tube secured with: Tape Dental Injury: Teeth and Oropharynx as per pre-operative assessment

## 2019-09-06 LAB — PHOSPHORUS: Phosphorus: 5 mg/dL — ABNORMAL HIGH (ref 2.5–4.6)

## 2019-09-06 LAB — BASIC METABOLIC PANEL
Anion gap: 7 (ref 5–15)
BUN: 17 mg/dL (ref 6–20)
CO2: 25 mmol/L (ref 22–32)
Calcium: 8.5 mg/dL — ABNORMAL LOW (ref 8.9–10.3)
Chloride: 105 mmol/L (ref 98–111)
Creatinine, Ser: 1.17 mg/dL (ref 0.61–1.24)
GFR calc Af Amer: >60 mL/min (ref >60)
GFR calc non Af Amer: >60 mL/min (ref >60)
Glucose, Bld: 115 mg/dL — ABNORMAL HIGH (ref 70–99)
Potassium: 4.1 mmol/L (ref 3.5–5.1)
Sodium: 137 mmol/L (ref 135–145)

## 2019-09-06 LAB — MAGNESIUM: Magnesium: 2.1 mg/dL (ref 1.7–2.4)

## 2019-09-06 NOTE — Progress Notes (Signed)
[facility] SURGICAL PROGRESS NOTE  Hospital Day(s): 1.   Post op day(s): 1 Day Post-Op.   Interval History:  Patient seen and examined no acute events or new complaints overnight.  Patient reports he is doing well, pain primarily at drain site, this is mild No nausea or emesis Leukocytosis to 14.7K; no fevers; likely reactive Hemoglobin stable at 11 Renal function normal; good UO - 1.6L Hyperphosphatemia otherwise no electrolyte derangement Surgical drain with 90 ccs out CLD post-op; tolerating well Passing flatus Awaiting foley removal before mobilizing   Vital signs in last 24 hours: [min-max] current  Temp:  [97.1 F (36.2 C)-98.2 F (36.8 C)] 97.8 F (36.6 C) (04/07 0550) Pulse Rate:  [86-107] 100 (04/07 0550) Resp:  [13-20] 16 (04/07 0550) BP: (128-146)/(79-99) 141/99 (04/07 0550) SpO2:  [93 %-100 %] 100 % (04/07 0507) Weight:  [103.9 kg] 103.9 kg (04/07 0550)     Height: 5' 9.02" (175.3 cm) Weight: 103.9 kg BMI (Calculated): 33.8   Intake/Output last 2 shifts:  04/06 0701 - 04/07 0700 In: 3370.4 [P.O.:240; I.V.:2670.4; IV Piggyback:350] Out: 2111 [BZMCE:0223; Drains:90]   Physical Exam:  Constitutional: alert, cooperative and no distress  Respiratory: breathing non-labored at rest  Cardiovascular: regular rate and sinus rhythm  Gastrointestinal: Soft, non-tender, and non-distended, no rebound/guarding, surgical drain in right abdomen with serosanguinous drainage Integumentary: Mini laparotomy and laparoscopic incisions are CDI with dermabond, no erythema or drainage  Labs:  CBC Latest Ref Rng & Units 09/05/2019 07/31/2019 07/29/2019  WBC 4.0 - 10.5 K/uL 14.7(H) 6.3 9.6  Hemoglobin 13.0 - 17.0 g/dL 11.5(L) 11.3(L) 11.1(L)  Hematocrit 39.0 - 52.0 % 34.2(L) 34.1(L) 33.1(L)  Platelets 150 - 400 K/uL 299 336 252   CMP Latest Ref Rng & Units 09/06/2019 09/05/2019 07/31/2019  Glucose 70 - 99 mg/dL 361(Q) - 86  BUN 6 - 20 mg/dL 17 - 7  Creatinine 2.44 -  1.24 mg/dL 9.75 3.00(F) 1.10  Sodium 135 - 145 mmol/L 137 - 139  Potassium 3.5 - 5.1 mmol/L 4.1 - 3.6  Chloride 98 - 111 mmol/L 105 - 104  CO2 22 - 32 mmol/L 25 - 25  Calcium 8.9 - 10.3 mg/dL 2.1(R) - 9.1  Total Protein 6.5 - 8.1 g/dL - - -  Total Bilirubin 0.3 - 1.2 mg/dL - - -  Alkaline Phos 38 - 126 U/L - - -  AST 15 - 41 U/L - - -  ALT 0 - 44 U/L - - -    Imaging studies: No new pertinent imaging studies   Assessment/Plan:  34 y.o. male overall doing well 1 Day Post-Op s/p robotic assisted LAR with EEA anastomosis and appendectomy for chronic complicated diverticulitis.   - Advance to full liquid diet   - Wean IVF  - Continue IV Abx (Rocephin + Flagyl)   - Pain control prn; antiemetics prn  - Monitor abdominal examination; on-going bowel function  - Monitor and record drain output daily  - Discontinue foley   - medical management or comorbid conditions  - mobilization encouraged  - Okay to resume DVT prophylaxis  All of the above findings and recommendations were discussed with the patient, and the medical team, and all of patient's questions were answered to his expressed satisfaction.  -- Lynden Oxford, PA-C [facility] 09/06/2019, 8:20 AM [phone] M-F: 7am - 4pm

## 2019-09-07 LAB — BASIC METABOLIC PANEL
Anion gap: 8 (ref 5–15)
BUN: 9 mg/dL (ref 6–20)
CO2: 26 mmol/L (ref 22–32)
Calcium: 8.8 mg/dL — ABNORMAL LOW (ref 8.9–10.3)
Chloride: 105 mmol/L (ref 98–111)
Creatinine, Ser: 0.78 mg/dL (ref 0.61–1.24)
GFR calc Af Amer: >60 mL/min (ref >60)
GFR calc non Af Amer: >60 mL/min (ref >60)
Glucose, Bld: 101 mg/dL — ABNORMAL HIGH (ref 70–99)
Potassium: 4 mmol/L (ref 3.5–5.1)
Sodium: 139 mmol/L (ref 135–145)

## 2019-09-07 LAB — SURGICAL PATHOLOGY

## 2019-09-07 MED ORDER — HYDROCODONE-ACETAMINOPHEN 5-325 MG PO TABS: 1.0000 | ORAL_TABLET | Freq: Four times a day (QID) | ORAL | 0 refills | Status: DC | PRN

## 2019-09-07 MED ORDER — AMOXICILLIN-POT CLAVULANATE 875-125 MG PO TABS: 1.0000 | ORAL_TABLET | Freq: Two times a day (BID) | ORAL | 0 refills | Status: DC

## 2019-09-07 MED ORDER — AMOXICILLIN-POT CLAVULANATE 875-125 MG PO TABS: 1.0000 | ORAL_TABLET | Freq: Two times a day (BID) | ORAL | Status: DC

## 2019-09-07 NOTE — Progress Notes (Signed)
[facility] SUR[facility]ICAL PROGRESS NOTE  Hospital Day(s): 2.   Post op day(s): 2 Days Post-Op.   Interval History:  Patient seen and examined no acute events or new complaints overnight.  Patient reports he is doing well, pain primarily at drain site No fever, chills, nausea, or emesis No electrolyte derangement; making good urine Tolerating full liquids, passing gas Surgical drain with 60 ccs mobilizing  Vital signs in last 24 hours: [min-max] current  Temp:  [98 F (36.7 C)-98.3 F (36.8 C)] 98.3 F (36.8 C) (04/08 0824) Pulse Rate:  [92-113] 96 (04/08 0824) Resp:  [15-20] 15 (04/08 0824) BP: (129-149)/(93-101) 143/96 (04/08 0824) SpO2:  [93 %-98 %] 96 % (04/08 0824)     Height: 5' 9.02" (175.3 cm) Weight: 103.9 kg BMI (Calculated): 33.8   Intake/Output last 2 shifts:  04/07 0701 - 04/08 0700 In: 50  Out: 335 [Urine:275; Drains:60]   Physical Exam:  Constitutional: alert, cooperative and no distress  Respiratory: breathing non-labored at rest  Cardiovascular: regular rate and sinus rhythm  Gastrointestinal: Soft, non-tender, and non-distended, no rebound/guarding, surgical drain in right abdomen with serosanguinous drainage Integumentary: Mini laparotomy and laparoscopic incisions are CDI with dermabond, no erythema or drainage  Labs:  CBC Latest Ref Rng & Units 09/05/2019 07/31/2019 07/29/2019  WBC 4.0 - 10.5 K/uL 14.7(H) 6.3 9.6  Hemoglobin 13.0 - 17.0 g/dL 11.5(L) 11.3(L) 11.1(L)  Hematocrit 39.0 - 52.0 % 34.2(L) 34.1(L) 33.1(L)  Platelets 150 - 400 K/uL 299 336 252   CMP Latest Ref Rng & Units 09/07/2019 09/06/2019 09/05/2019  Glucose 70 - 99 mg/dL 875(I) 433(I) -  BUN 6 - 20 mg/dL 9 17 -  Creatinine 9.51 - 1.24 mg/dL 8.84 1.66 0.63(K)  Sodium 135 - 145 mmol/L 139 137 -  Potassium 3.5 - 5.1 mmol/L 4.0 4.1 -  Chloride 98 - 111 mmol/L 105 105 -  CO2 22 - 32 mmol/L 26 25 -  Calcium 8.9 - 10.3 mg/dL 1.6(W) 1.0(X) -  Total Protein 6.5 - 8.1 g/dL - - -  Total  Bilirubin 0.3 - 1.2 mg/dL - - -  Alkaline Phos 38 - 126 U/L - - -  AST 15 - 41 U/L - - -  ALT 0 - 44 U/L - - -     Imaging studies: No new pertinent imaging studies   Assessment/Plan:  34 y.o. male overall doing well with return of bowel fucntion 2 Days Post-Op s/p robotic assisted LAR with EEA anastomosis and appendectomy for chronic complicated diverticulitis.   - Advance to soft diet  - Discontinue IVF   - Continue IV Abx (Rocephin + Flagyl)              - Pain control prn; antiemetics prn             - Monitor abdominal examination; on-going bowel function             - Monitor and record drain output daily             - medical management or comorbid conditions             - mobilization encouraged             -  DVT prophylaxis   - Discharge Planning: Home in the AM, PO ABx for home, drain teaching   All of the above findings and recommendations were discussed with the patient, and the medical team, and all of patient's questions were answered to his expressed  satisfaction.  -- Edison Simon, PA-C [facility] 09/07/2019, 9:16 AM [phone] M-F: 7am - 4pm

## 2019-09-07 NOTE — TOC Transition Note (Signed)
Transition of Care [facility]) - CM/SW Discharge Note   Patient Details  Name: Dale Schwartz MRN: [id number] Date of Birth: May 10, 1986  [facility] CM/SW Contact:  Liliana Cline, LCSW Phone Number: 09/07/2019, 4:10 PM   Clinical Narrative:   Patient has orders to discharge home today. Patient is uninsured. CSW spoke with patient and patient's wife. They reported patient does not have a PCP and were open to CSW completing referral to [facility]. CSW provided [facility] information to patient. CSW informed [facility] Representative Benjamin Stain of referral. Patient and his wife were not interested in using [facility] for patient's new prescriptions because 1 of the 2 prescriptions is a controlled substance that they cannot get there. They opted to get the medications from a local pharmacy. They denied additional needs. CSW is Signing off.     Final next level of care: Home/Self Care     Patient Goals and CMS Choice        Discharge Placement                Patient to be transferred to facility by: Wife Name of family member notified: Patient and wife Patient and family notified of of transfer: 09/07/19  Discharge Plan and Services                                     Social Determinants of Health (SDOH) Interventions     Readmission Risk Interventions No flowsheet data found.

## 2019-09-07 NOTE — Progress Notes (Signed)
   09/07/19 1300  Clinical Encounter Type  Visited With Patient and family together  Visit Type Follow-up  Referral From Chaplain  Consult/Referral To Chaplain  Chaplain visited briefly with patient and family. Patient said he is feeling much better. While Chaplain visited with patient another lunch tray came and he was happy about the chicken tenders and fries. Chaplain told him she would be back later to check on him. Patient was grateful for Chaplain Theresa's visit. He said that she help eased his fears. Chaplain told him that she will make sure that Aggie Cosier is aware of how her visit affected him.

## 2019-09-07 NOTE — Progress Notes (Signed)
   September 07, 2019  Patient: Dale Schwartz  Date of Birth: 09-23-1985  Date of Visit: 08/29/2019    To Whom It May Concern:  Dale Schwartz was seen and treated in our unit, 2C General Surgery from 09/05/19 until 09/07/19 under the care of Dr. Everlene Farrier, general surgeon.  If more information is needed, call the unit at [phone]  Sincerely,   Bradly Chris, RN

## 2019-09-07 NOTE — Discharge Summary (Signed)
[facility] SURGICAL DISCHARGE SUMMARY  Patient ID: Dale Schwartz MRN: [id number] DOB/AGE: 1986/04/09 33 y.o.  Admit date: 09/05/2019 Discharge date: 09/07/2019  Discharge Diagnoses Patient Active Problem List   Diagnosis Date Noted  . Status post surgery 09/05/2019  . Diverticulitis of colon 09/05/2019  . Diverticulitis of large intestine with perforation without bleeding 07/27/2019  . Diverticulitis of large intestine with perforation and abscess without bleeding 05/18/2019    Consultants None  Procedures 09/05/2019:  Robotic assisted laparoscopic Low anterior resection with EEA anastomosis Open appendectomy Takedown of splenic flexure robotic assisted  HPI: 34 year old male well-known to me with history of complicated diverticulitis with abscess requiring percutaneous drain placement x2 who presents to [facility] on 04/09 for robotic assisted sigmoid colectomy with [facility] Course: Informed consent was obtained and documented, and patient underwent uneventful robotic assisted laparoscopic sigmoid colectomy and EEA anastomosis (Dr Everlene Farrier, 09/05/2019).  Post-operatively, patient did very well. Advancement of patient's diet and ambulation were well-tolerated. The remainder of patient's hospital course was essentially unremarkable, and discharge planning was initiated accordingly with patient safely able to be discharged home with appropriate discharge instructions, antibiotics (Augmentin x7 days), pain control, and outpatient follow-up after all of hisquestions were answered to his expressed satisfaction.   Discharge Condition: Good     Allergies as of 09/07/2019   No Known Allergies     Medication List    STOP taking these medications   bisacodyl 5 MG EC tablet Commonly known as: DULCOLAX   erythromycin base 500 MG tablet Commonly known as: E-MYCIN   neomycin 500 MG tablet Commonly known as: MYCIFRADIN   polyethylene glycol powder 17  GM/SCOOP powder Commonly known as: MiraLax     TAKE these medications   amoxicillin-clavulanate 875-125 MG tablet Commonly known as: AUGMENTIN Take 1 tablet by mouth every 12 (twelve) hours.   HYDROcodone-acetaminophen 5-325 MG tablet Commonly known as: NORCO/VICODIN Take 1-2 tablets by mouth every 6 (six) hours as needed for moderate pain.   ibuprofen 600 MG tablet Commonly known as: ADVIL Take 1 tablet (600 mg total) by mouth every 8 (eight) hours as needed.        Follow-up Information    Pabon, [place] F, MD Follow up in 1 week(s).   Specialty: General Surgery Contact information: [address] [place] [address] [phone]            Time spent on discharge management including discussion of hospital course, clinical condition, outpatient instructions, prescriptions, and follow up with the patient and members of the medical team: >30 minutes  -- Lynden Oxford , PA-C [facility]  09/07/2019, 3:35 PM [phone] M-F: 7am - 4pm

## 2019-09-07 NOTE — Anesthesia Postprocedure Evaluation (Signed)
Anesthesia Post Note  Patient: Dale Schwartz Leisure centre manager  Procedure(s) Performed: XI ROBOT ASSISTED LAPAROSCOPIC PARTIAL Sigmoid COLECTOMY possible ostemy (N/A ) XI ROBOTIC LAPAROSCOPIC ASSISTED APPENDECTOMY (N/A Abdomen) INDOCYANINE GREEN FLUORESCENCE IMAGING (ICG)  Patient location during evaluation: PACU Anesthesia Type: General Level of consciousness: awake and alert Pain management: pain level controlled Vital Signs Assessment: post-procedure vital signs reviewed and stable Respiratory status: spontaneous breathing, nonlabored ventilation, respiratory function stable and patient connected to nasal cannula oxygen Cardiovascular status: blood pressure returned to baseline and stable Postop Assessment: no apparent nausea or vomiting Anesthetic complications: no     Last Vitals:  Vitals:   09/07/19 0548 09/07/19 0824  BP: (!) 149/101 (!) 143/96  Pulse: (!) 107 96  Resp:  15  Temp:  36.8 C  SpO2: 93% 96%    Last Pain:  Vitals:   09/07/19 0824  TempSrc: Oral  PainSc:                  Yevette Edwards

## 2019-09-09 ENCOUNTER — Ambulatory Visit: Payer: MEDICAID | Attending: Internal Medicine

## 2019-09-09 DIAGNOSIS — Z23 Encounter for immunization: Secondary | ICD-10-CM

## 2019-09-09 NOTE — Progress Notes (Signed)
   Covid-19 Vaccination Clinic  Name:  Dale Schwartz    MRN: [id number] DOB: 02-16-1986  09/09/2019  Mr. Dale Schwartz was observed post Covid-19 immunization for 30 minutes based on pre-vaccination screening without incident. He was provided with Vaccine Information Sheet and instruction to access the V-Safe system.   Mr. Dale Schwartz was instructed to call 911 with any severe reactions post vaccine: Marland Kitchen Difficulty breathing  . Swelling of face and throat  . A fast heartbeat  . A bad rash all over body  . Dizziness and weakness   Immunizations Administered    Name Date Dose VIS Date Route   Pfizer COVID-19 Vaccine 09/09/2019  2:41 PM 0.3 mL 05/12/2019 Intramuscular   Manufacturer: [employer], [employer]   Lot: [id number]   NDC: [address]-0

## 2019-09-13 ENCOUNTER — Telehealth: Payer: Self-pay | Admitting: General Practice

## 2019-09-13 ENCOUNTER — Ambulatory Visit (INDEPENDENT_AMBULATORY_CARE_PROVIDER_SITE_OTHER): Payer: Self-pay | Admitting: Surgery

## 2019-09-13 ENCOUNTER — Other Ambulatory Visit: Payer: Self-pay

## 2019-09-13 ENCOUNTER — Encounter: Payer: Self-pay | Admitting: Surgery

## 2019-09-13 VITALS — BP 116/110 | HR 83 | Temp 97.9°F | Ht 68.0 in | Wt 237.4 lb

## 2019-09-13 DIAGNOSIS — Z9049 Acquired absence of other specified parts of digestive tract: Secondary | ICD-10-CM

## 2019-09-13 NOTE — Telephone Encounter (Signed)
I spoke with patient about eligibility. Patient is to come later today to pick up application and go over supporting documents.

## 2019-09-13 NOTE — Patient Instructions (Signed)
we have placed a referral for you to see [facility] to do a colonoscopy in 2 months. They will call you to set up an appointment.  The steri-strips will begin to fall within 7-10 days.  follow up as needed. call the office if you have any questions or concerns.   hemos colocado una derivacin para que vea a [facility] para realizar una colonoscopia en 2 meses. Lo llamarn para programar una cita. Las tiras esterilizadas comenzarn a Designer, jewellery de los 7 a 10 das. seguimiento segn sea necesario. llame a la oficina si tiene alguna pregunta o inquietud.  Colectoma laparoscpica: cuidados posteriores Laparoscopic Colectomy, Care After Lea esta informacin sobre cmo cuidarse despus del procedimiento. Su mdico tambin podr darle indicaciones ms especficas. Comunquese con su mdico si tiene problemas o preguntas. Qu puedo esperar despus del procedimiento? Despus del procedimiento, es normal tener los siguientes sntomas:  Dolor en el abdomen, especialmente en el lugar de las incisiones. Le darn medicamentos para Financial controller.  Cansancio. Es Ardelia Mems etapa normal del proceso de recuperacin. Su nivel de Research officer, trade union a la normalidad en las prximas semanas.  Cambios en las deposiciones, por ejemplo, estreimiento o necesidad de defecar ms seguido. Hable con el mdico sobre cmo manejar esta situacin. Siga estas indicaciones en su casa: Medicamentos  [employer] de venta libre y los recetados solamente como se lo haya indicado el mdico.  No conduzca ni use maquinaria pesada mientras toma analgsicos recetados.  No consuma alcohol mientras toma analgsicos recetados.  Si le recetaron un antibitico, tmelo como se lo haya indicado el mdico. No deje de usar el antibitico aunque comience a Sports administrator. Cuidados de la incisin   Siga las indicaciones del mdico acerca del cuidado de los lugares de las incisiones. Haga lo  siguiente: ? [place] y secas. ? Lvese las manos con agua y [place] antes y despus de Careers information officer en los lugares de las incisiones y Noonday vendas (vendaje). Use desinfectante para manos si no dispone de [place] y [place]. ? Cambie los vendajes como se lo haya indicado el mdico. ? No retire los puntos (suturas), la goma para cerrar la piel o las tiras [place]. Es posible que estos cierres cutneos Animal nutritionist en la piel durante 2semanas o ms. Si los bordes de las tiras adhesivas empiezan a despegarse y Therapist, sports, puede recortar los que estn sueltos. No retire las tiras [facility] por completo a menos que el mdico se lo indique.  No use ropa [employer]. La ropa ajustada puede raspar e irritar los lugares de las incisiones, las cuales podran abrirse.  No tome baos de inmersin, no nade ni use el jacuzzi hasta que el mdico lo autorice. Pregntele al mdico si puede ducharse. Thurston Pounds solo le permitan tomar baos de [place].  Wallace zona de la incisin para detectar signos de infeccin. Est atento a los siguientes signos: ? Aumento del enrojecimiento, de la hinchazn o del dolor. ? Mayor presencia de lquido o [place]. ? Calor. ? Pus o mal olor. Actividad  No levante nada que pese ms de 10libras (4,5kg) durante 2semanas o hasta que el mdico lo autorice.  Puede retomar sus actividades normales como se lo haya indicado el mdico. Pregntele al mdico qu actividades son seguras para usted.  Durante el da descanse cuanto sea necesario. Comida y bebida  Siga las indicaciones del mdico respecto de lo que puede comer despus de la  ciruga.  A fin de prevenir o tratar el estreimiento mientras toma analgsicos recetados, el mdico puede recomendarle lo siguiente: ? Beba suficiente lquido como para mantener la orina clara o de color amarillo plido. ? Tome medicamentos recetados o de venta  [place]. ? Consuma alimentos ricos en fibra, como frutas y verduras frescas, cereales integrales y frijoles. ? Limite el consumo de alimentos ricos en grasa y azcares procesados, como alimentos fritos o dulces. Instrucciones generales  Pregntele al mdico para cundo debe programar una cita para que le retiren los puntos o las grapas.  Concurra a todas las visitas de [address] se lo haya indicado el mdico. Esto es importante. Comunquese con un mdico si:  Aumentan el enrojecimiento, la hinchazn o el dolor alrededor [employer].  Las incisiones supuran ms lquido o [place].  Las incisiones estn calientes al tacto.  Tiene secrecin de pus o percibe un olor ftido que emanan de las incisiones o del vendaje.  Tiene fiebre.  Se le abre alguna incisin (los bordes se separan) luego de que le retiran las suturas o las grapas. Solicite ayuda de inmediato si:  Presenta una erupcin cutnea.  Tiene dolor en el pecho o dificultad para respirar.  Presenta dolor o hinchazn en las piernas.  Se siente mareado o se desmaya.  Tiene el abdomen hinchado (se ledistiende).  Tiene nuseas o vmitos.  Observa sangre en la materia fecal (heces). Esta informacin no tiene Theme park manager el consejo del mdico. Asegrese de hacerle al mdico cualquier pregunta que tenga. Document Revised: 12/25/2016 Document Reviewed: 10/22/2015 Elsevier Patient Education  2020 [employer].

## 2019-09-14 ENCOUNTER — Encounter: Payer: Self-pay | Admitting: Surgery

## 2019-09-14 NOTE — Progress Notes (Signed)
Status post robotic colectomy a week ago.  Doing very well.  Tolerating regular diet.  He denies any abdominal pain. He actually took some of his staples himself.  PE NAD Abd: soft, nt, staples removed and steri strips placed. No infection or peritonitis  A/P doing very well No heavy lifting Colonoscopy in 2 months RTC 3 months

## 2020-04-19 ENCOUNTER — Other Ambulatory Visit: Payer: Self-pay | Admitting: Family Medicine

## 2020-04-19 DIAGNOSIS — R7401 Elevation of levels of liver transaminase levels: Secondary | ICD-10-CM

## 2020-04-23 ENCOUNTER — Telehealth: Payer: Self-pay

## 2020-05-01 ENCOUNTER — Ambulatory Visit
Admission: RE | Admit: 2020-05-01 | Discharge: 2020-05-01 | Disposition: A | Discharge status: Home / Self Care | Payer: 59 | Source: Ambulatory Visit | Attending: Family Medicine | Admitting: Family Medicine

## 2020-05-01 ENCOUNTER — Other Ambulatory Visit: Payer: Self-pay

## 2020-05-01 DIAGNOSIS — R7401 Elevation of levels of liver transaminase levels: Secondary | ICD-10-CM | POA: Insufficient documentation

## 2021-11-24 IMAGING — CT CT IMAGE GUIDED DRAINAGE BY PERCUTANEOUS CATHETER
1 of 2 series · 14 of 32 positions shown, 19 images · non-contrast
Comparison: none

CLINICAL DATA: Recurrent diverticular abscess

[Series 2: i-spiral 5.0 b30f · axial · 0.81mm/px · z∈[+452,+606]mm · 14 of 50 slices shown, 19 images]
[im 3/50  soft-tissue]
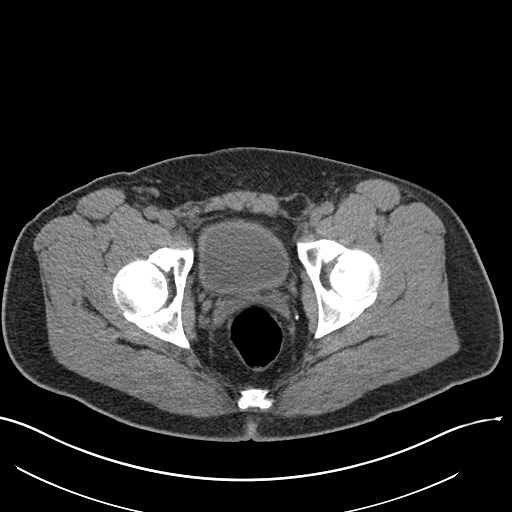
[im 3/50  bone]
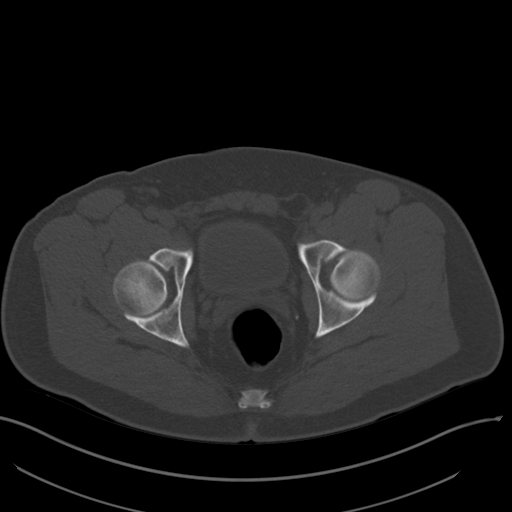
[im 8/50  soft-tissue]
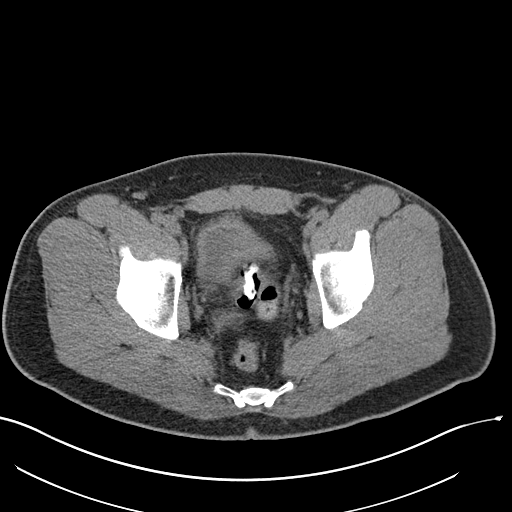
[im 11/50  soft-tissue]
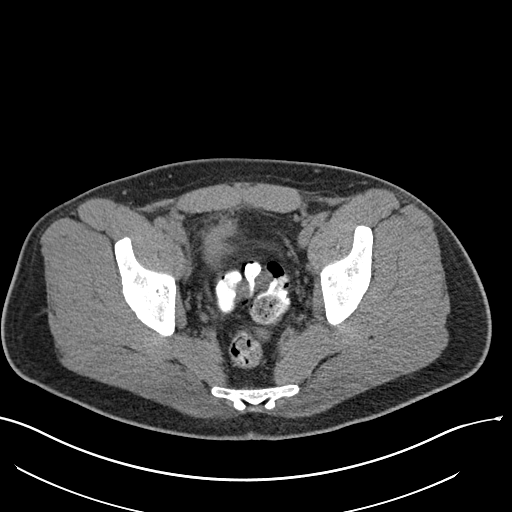
[im 13/50  soft-tissue]
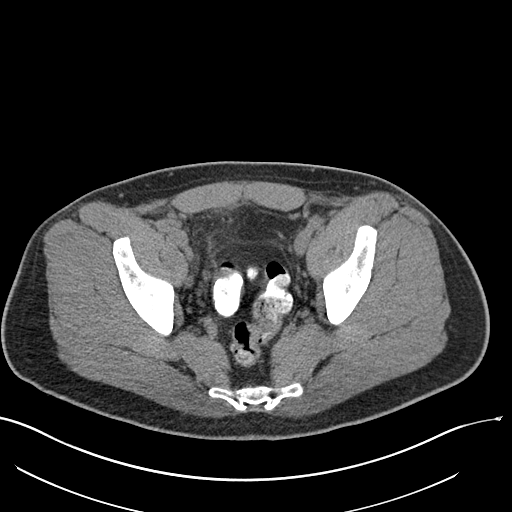
[im 19/50  soft-tissue]
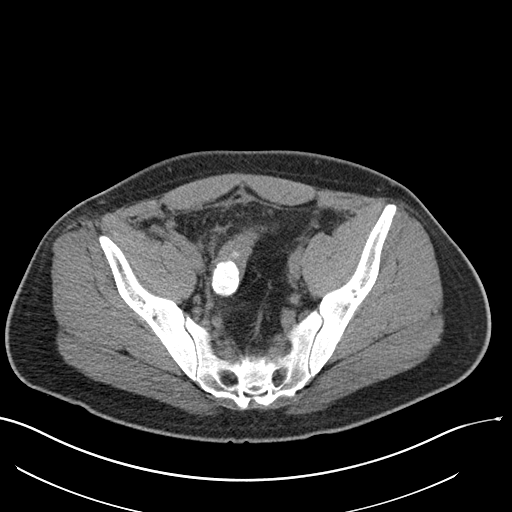
[im 21/50  soft-tissue]
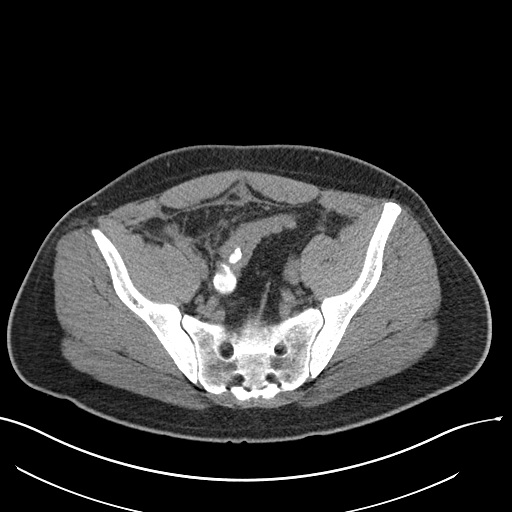
[im 26/50  soft-tissue]
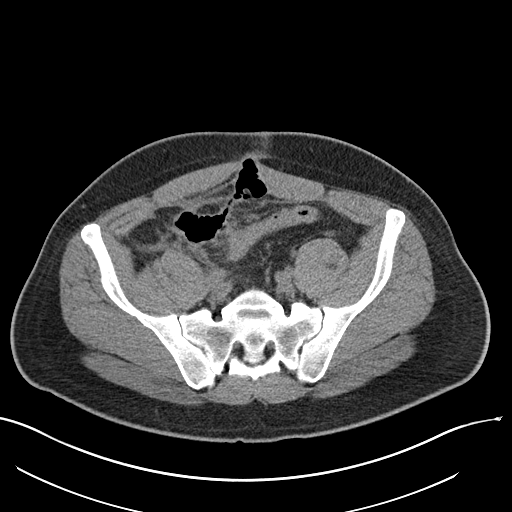
[im 29/50  soft-tissue]
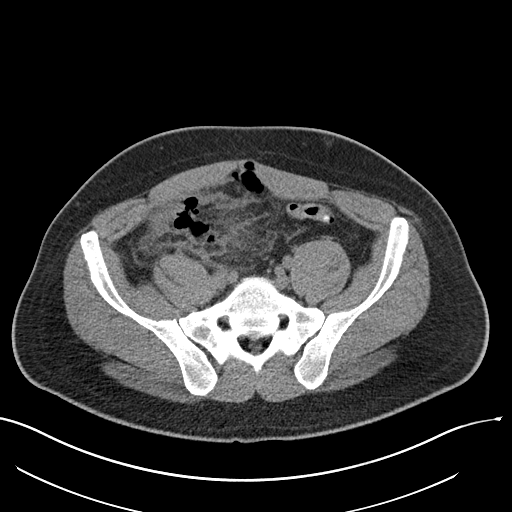
[im 31/50  soft-tissue]
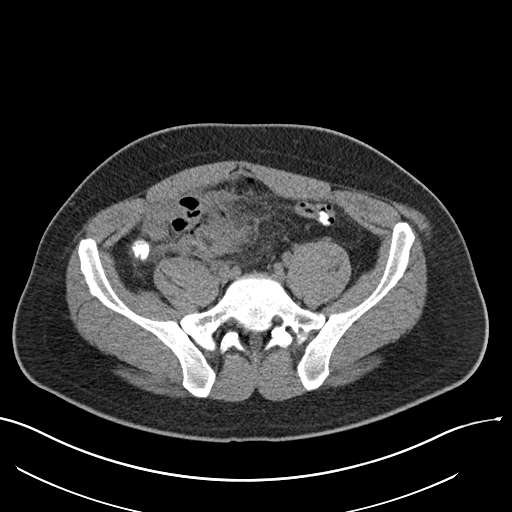
[im 31/50  bone]
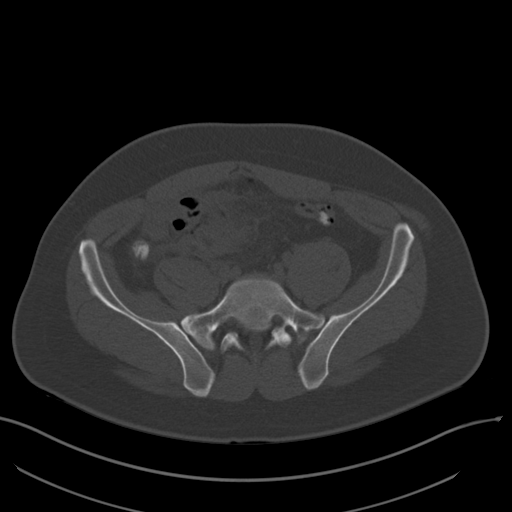
[im 37/50  soft-tissue]
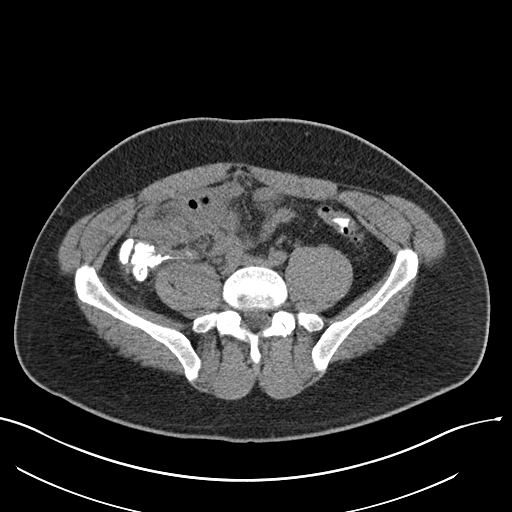
[im 39/50  soft-tissue]
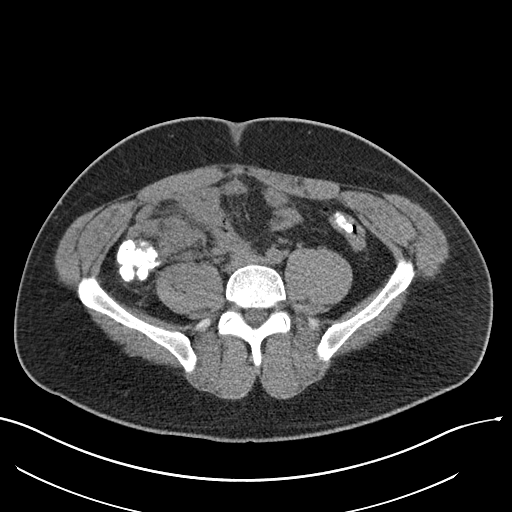
[im 39/50  lung]
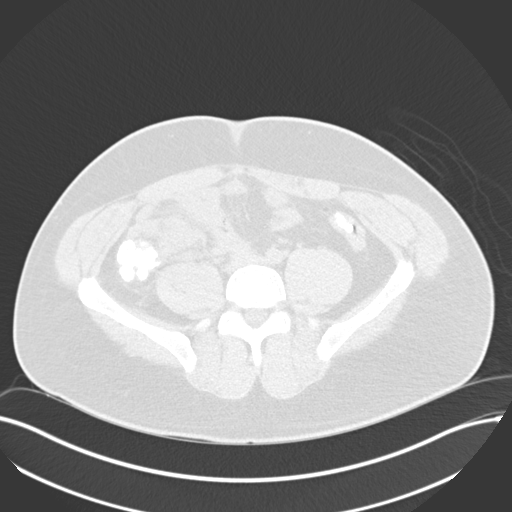
[im 42/50  soft-tissue]
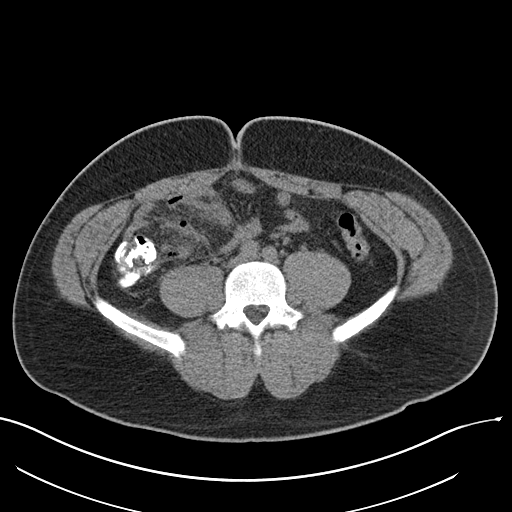
[im 42/50  lung]
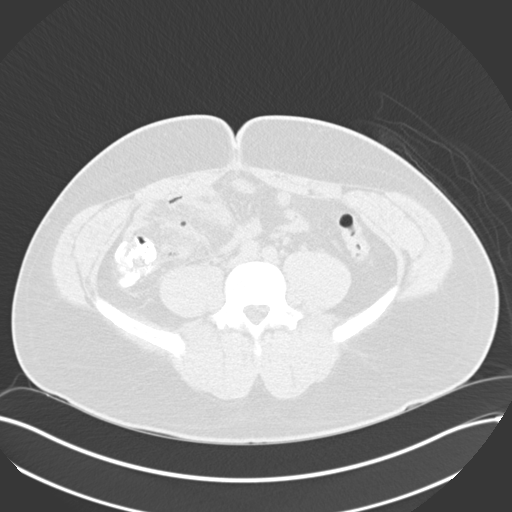
[im 44/50  lung]
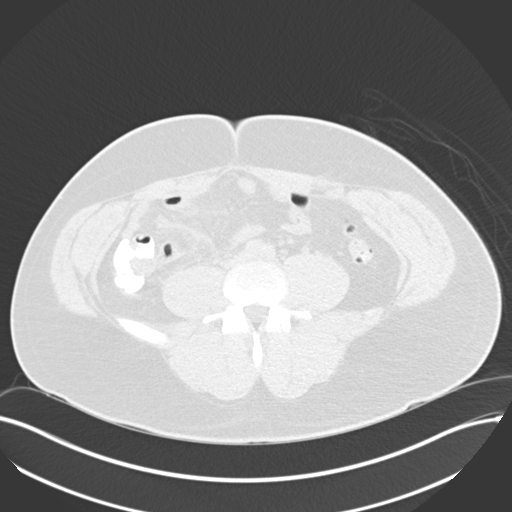
[im 47/50  soft-tissue]
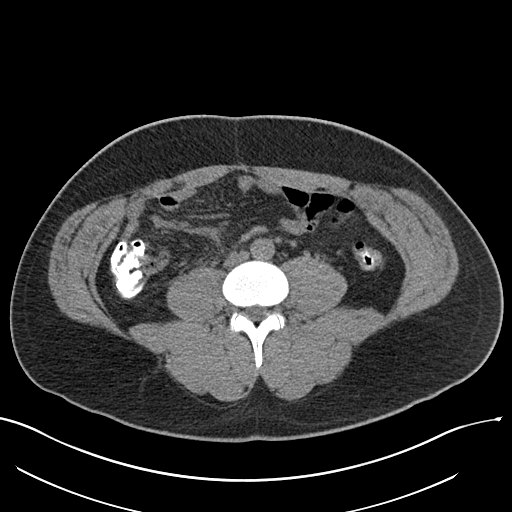
[im 47/50  lung]
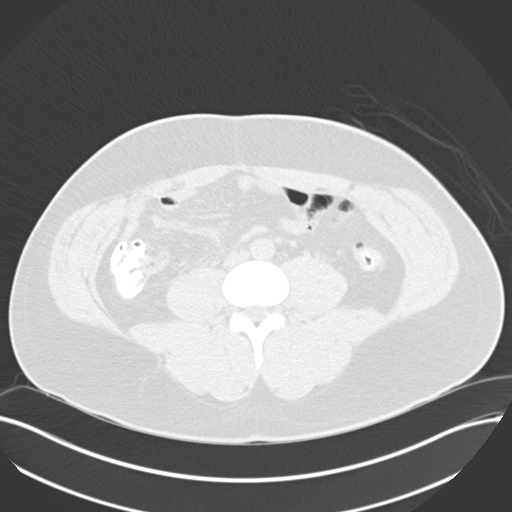

[14 of 32 positions shown; findings below may reference images not displayed]

EXAM:
CT GUIDED DRAINAGE OF PELVIC ABSCESS

ANESTHESIA/SEDATION:
Intravenous Fentanyl 755mcg and Versed 4mg were administered as
conscious sedation during continuous monitoring of the patient's
level of consciousness and physiological / cardiorespiratory status
by the radiology RN, with a total moderate sedation time of 25
minutes.

PROCEDURE:
The procedure, risks, benefits, and alternatives were explained to
the patient. Questions regarding the procedure were encouraged and
answered. The patient understands and consents to the procedure.

Select axial scans through the upper pelvis obtained. The right
lower quadrant collection was localized and an appropriate skin
entry site was determined and marked.

The operative field was prepped with chlorhexidinein a sterile
fashion, and a sterile drape was applied covering the operative
field. A sterile gown and sterile gloves were used for the
procedure. Local anesthesia was provided with 1% Lidocaine.

Under CT fluoroscopic guidance, 18 gauge trocar needle advanced into
the collection. Gas spun tennis intern through the needle hub.
Amplatz guidewire advanced easily within the cavity, position
confirmed on CT. Tract dilated to facilitate placement of a 12
French pigtail drain catheter, formed centrally within the
collection. Much of the local gas was evaluated. With lavage, small
amount of debris also return, sent for Gram stain and culture. The
catheter was secured externally with 0 Prolene suture and StatLock
and placed to gravity drain bag. The patient tolerated the procedure
well.

COMPLICATIONS:
None immediate
FINDINGS: Right lower quadrant peritoneal multiloculated predominately gas and
fluid collection was localized. 12 French pigtail drain catheter
placed into the dependent dominant component.
IMPRESSION: 1. Technically successful right lower quadrant pelvic abscess drain
catheter placement

## 2022-04-29 DIAGNOSIS — R6883 Chills (without fever): Secondary | ICD-10-CM | POA: Diagnosis not present

## 2022-04-29 DIAGNOSIS — I10 Essential (primary) hypertension: Secondary | ICD-10-CM | POA: Diagnosis not present

## 2022-04-29 DIAGNOSIS — Z9049 Acquired absence of other specified parts of digestive tract: Secondary | ICD-10-CM | POA: Diagnosis not present

## 2022-04-29 DIAGNOSIS — Z79899 Other long term (current) drug therapy: Secondary | ICD-10-CM | POA: Diagnosis not present

## 2022-04-29 DIAGNOSIS — R519 Headache, unspecified: Secondary | ICD-10-CM | POA: Diagnosis not present

## 2022-04-29 DIAGNOSIS — R110 Nausea: Secondary | ICD-10-CM | POA: Diagnosis not present

## 2022-08-26 IMAGING — US US ABDOMEN COMPLETE
1 series · 14 of 25 positions shown · non-contrast
Comparison: CT abdomen and pelvis 07/30/2019

CLINICAL DATA: Abnormal LFTs, negative acute appetite is panel

EXAM:
ABDOMEN ULTRASOUND COMPLETE

[Series 1: us abdomen complete · 14 of 85 slices shown]
[im 1/85]
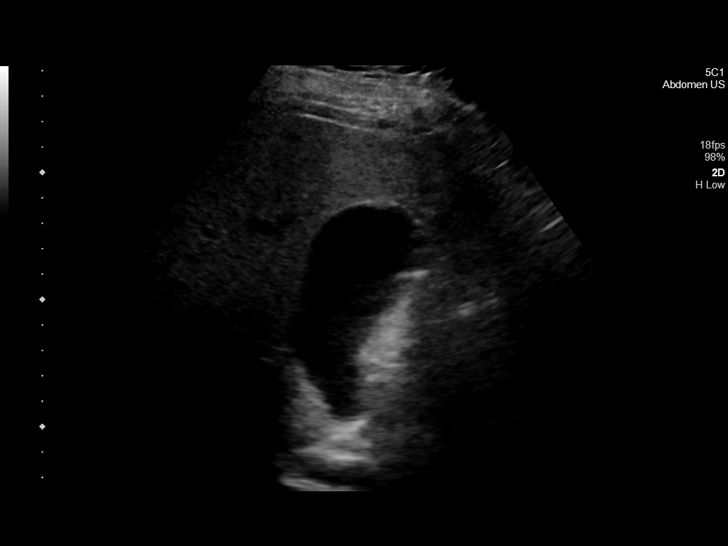
[im 8/85]
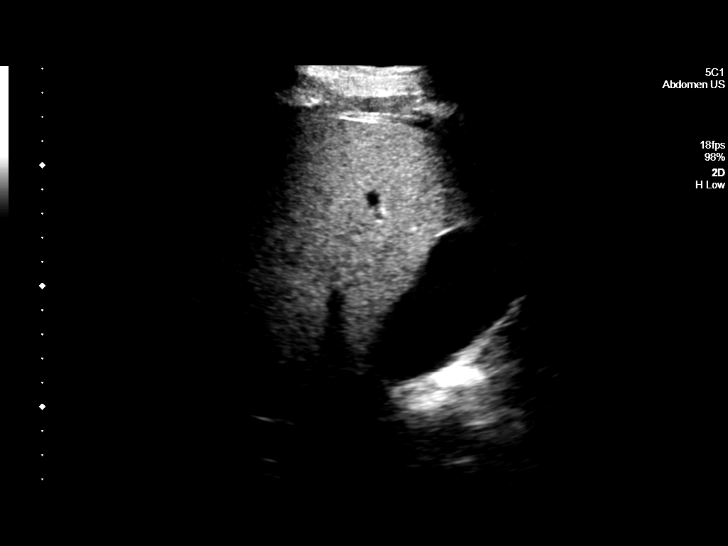
[im 15/85]
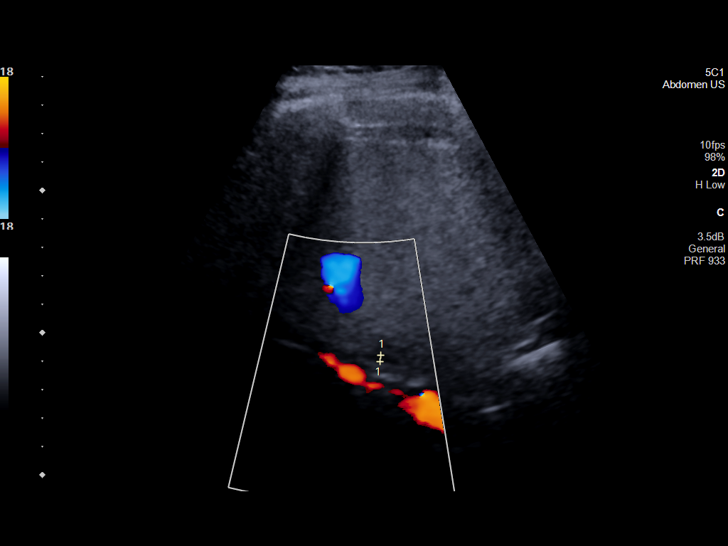
[im 22/85]
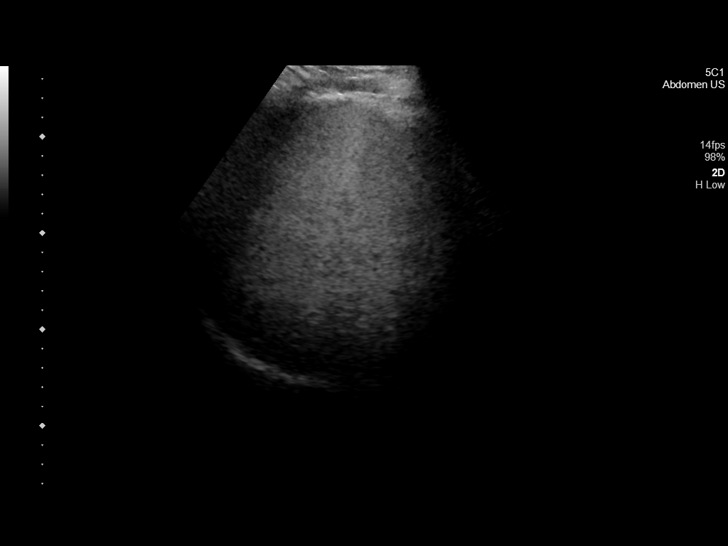
[im 29/85]
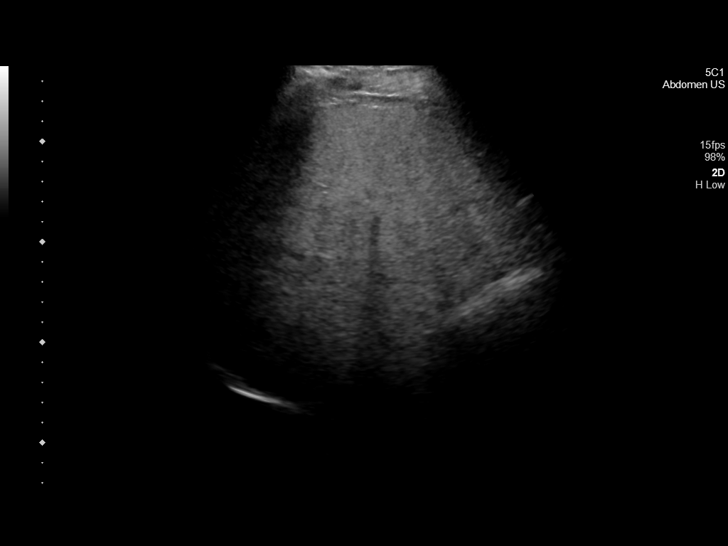
[im 32/85]
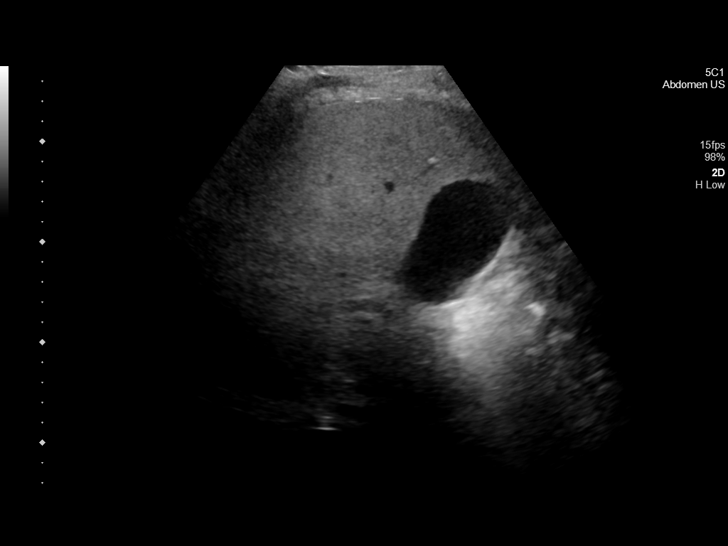
[im 39/85]
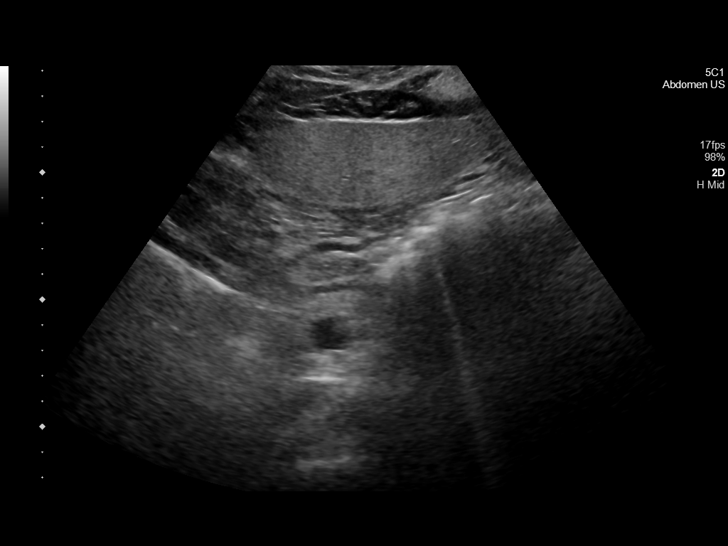
[im 46/85]
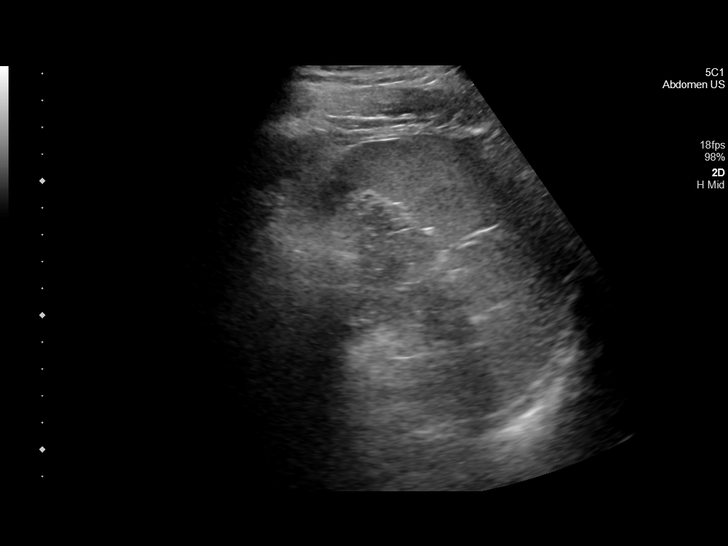
[im 53/85]
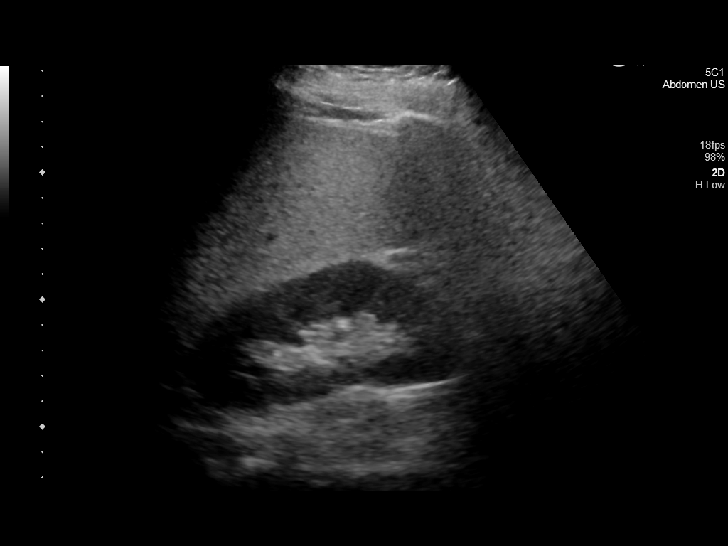
[im 57/85]
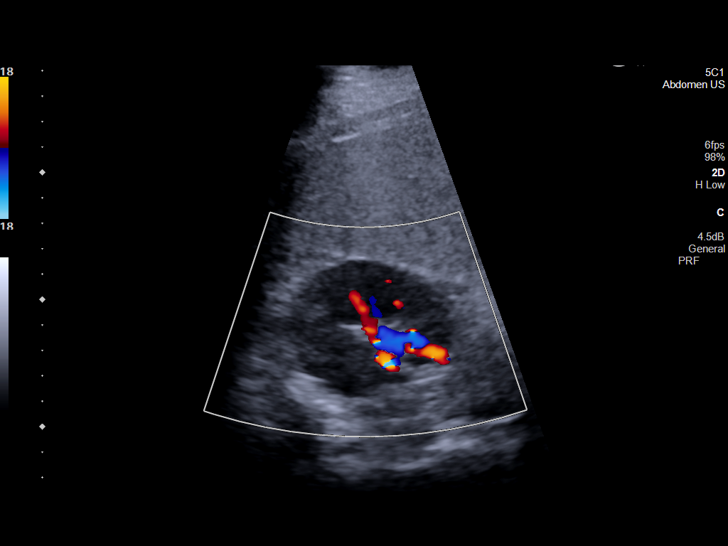
[im 64/85]
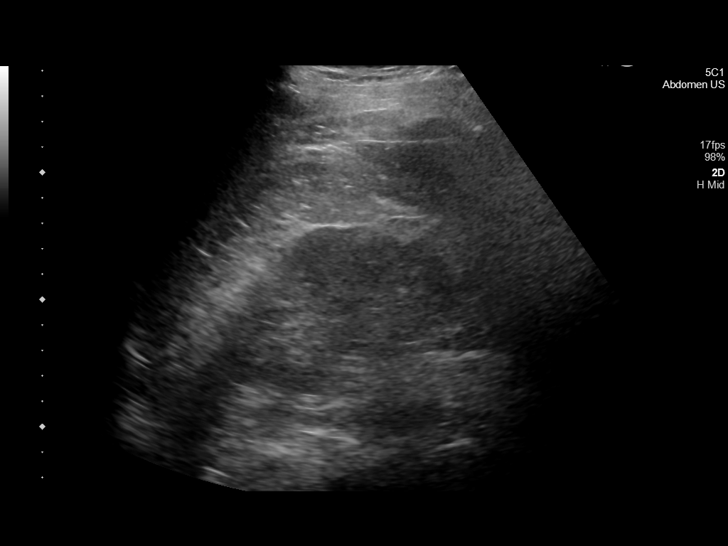
[im 71/85]
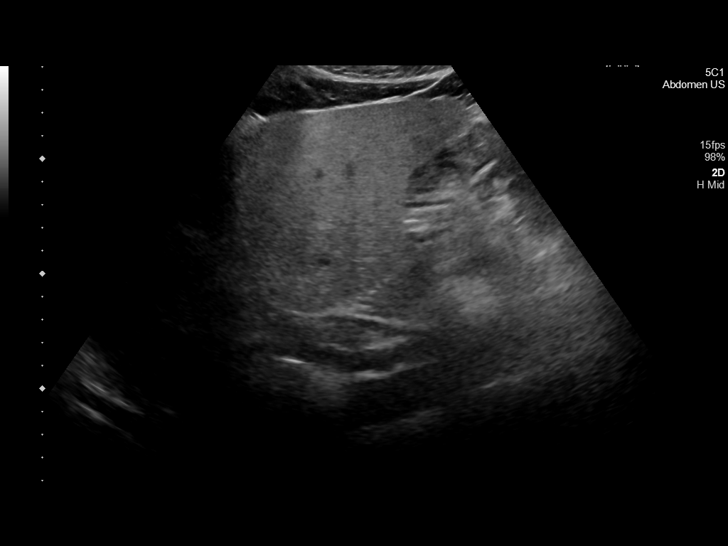
[im 78/85]
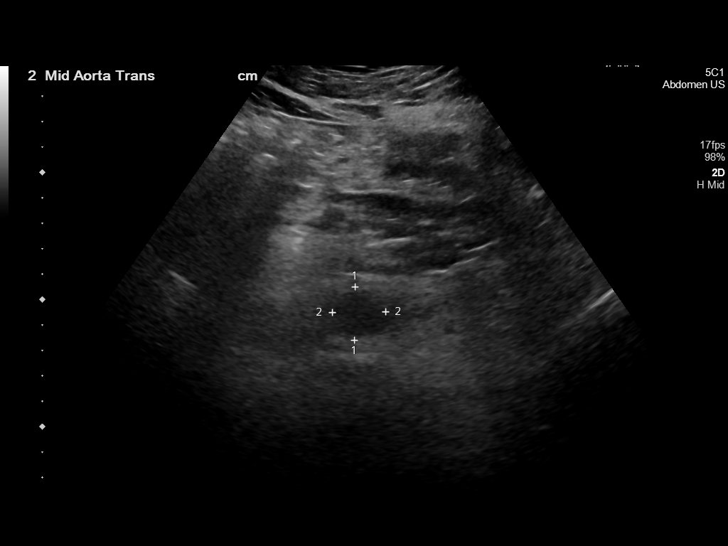
[im 85/85]
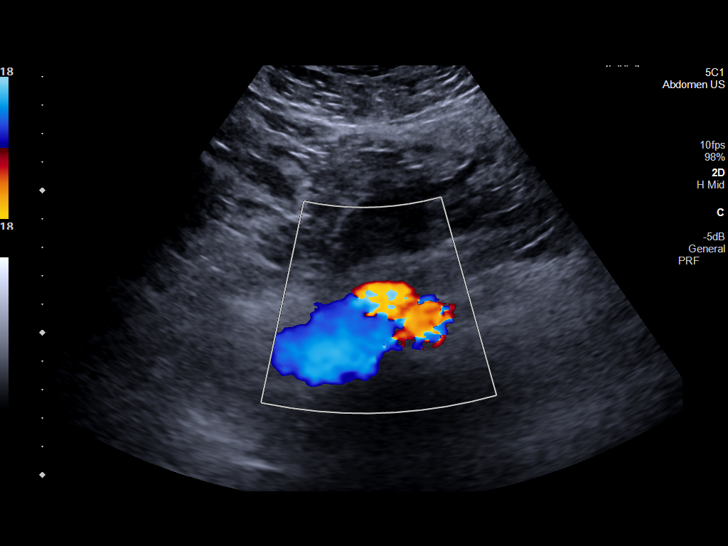

[14 of 25 positions shown; findings below may reference images not displayed]

FINDINGS: Gallbladder: Normally distended without stones or wall thickening.
No pericholecystic fluid or sonographic Murphy sign.

Common bile duct: Diameter: 2 mm, normal

Liver: Echogenic parenchyma, likely fatty infiltration though this
can be seen with cirrhosis and certain infiltrative disorders. No
focal hepatic mass or nodularity. No intrahepatic biliary
dilatation. Portal vein is patent on color Doppler imaging with
normal direction of blood flow towards the liver.

IVC: Normal appearance

Pancreas: Small portion of pancreatic body and proximal tail normal
appearance, remainder obscured by bowel gas

Spleen: Normal appearance, 7.3 cm length

Right Kidney: Length: 11.9 cm. Normal morphology without mass or
hydronephrosis.

Left Kidney: Length: 11.8 cm. Normal morphology without mass or
hydronephrosis.

Abdominal aorta: Normal caliber

Other findings: No free fluid
IMPRESSION: Probable fatty infiltration of liver as above.

Incomplete pancreatic visualization.

Remainder of exam unremarkable.

## 2023-04-15 DIAGNOSIS — R351 Nocturia: Secondary | ICD-10-CM | POA: Diagnosis not present

## 2023-04-15 DIAGNOSIS — Z23 Encounter for immunization: Secondary | ICD-10-CM | POA: Diagnosis not present

## 2023-04-15 DIAGNOSIS — I10 Essential (primary) hypertension: Secondary | ICD-10-CM | POA: Diagnosis not present

## 2023-04-15 DIAGNOSIS — Z1389 Encounter for screening for other disorder: Secondary | ICD-10-CM | POA: Diagnosis not present

## 2023-04-15 DIAGNOSIS — Z0000 Encounter for general adult medical examination without abnormal findings: Secondary | ICD-10-CM | POA: Diagnosis not present

## 2023-04-15 DIAGNOSIS — L819 Disorder of pigmentation, unspecified: Secondary | ICD-10-CM | POA: Diagnosis not present

## 2023-04-15 DIAGNOSIS — Z0131 Encounter for examination of blood pressure with abnormal findings: Secondary | ICD-10-CM | POA: Diagnosis not present

## 2023-04-15 DIAGNOSIS — Z131 Encounter for screening for diabetes mellitus: Secondary | ICD-10-CM | POA: Diagnosis not present

## 2023-04-15 DIAGNOSIS — Z1331 Encounter for screening for depression: Secondary | ICD-10-CM | POA: Diagnosis not present

## 2023-04-21 DIAGNOSIS — E119 Type 2 diabetes mellitus without complications: Secondary | ICD-10-CM | POA: Diagnosis not present
